# Patient Record
Sex: Female | Born: 1970 | Race: Black or African American | Hispanic: No | Smoking: Never smoker
Health system: Southern US, Community
[De-identification: ages and names within clinical notes are randomized; demographics above are authoritative.]

## PROBLEM LIST (undated history)

## (undated) DIAGNOSIS — D259 Leiomyoma of uterus, unspecified: Secondary | ICD-10-CM

## (undated) DIAGNOSIS — R000 Tachycardia, unspecified: Secondary | ICD-10-CM

## (undated) DIAGNOSIS — M1990 Unspecified osteoarthritis, unspecified site: Secondary | ICD-10-CM

## (undated) DIAGNOSIS — G43909 Migraine, unspecified, not intractable, without status migrainosus: Secondary | ICD-10-CM

## (undated) DIAGNOSIS — M549 Dorsalgia, unspecified: Secondary | ICD-10-CM

## (undated) DIAGNOSIS — F419 Anxiety disorder, unspecified: Secondary | ICD-10-CM

## (undated) DIAGNOSIS — K219 Gastro-esophageal reflux disease without esophagitis: Secondary | ICD-10-CM

## (undated) DIAGNOSIS — D509 Iron deficiency anemia, unspecified: Secondary | ICD-10-CM

## (undated) HISTORY — DX: Anxiety disorder, unspecified: F41.9

## (undated) HISTORY — PX: TUBAL LIGATION: SHX77

## (undated) HISTORY — DX: Migraine, unspecified, not intractable, without status migrainosus: G43.909

## (undated) HISTORY — DX: Gastro-esophageal reflux disease without esophagitis: K21.9

---

## 1998-06-23 ENCOUNTER — Other Ambulatory Visit: Admission: RE | Admit: 1998-06-23 | Discharge: 1998-06-23 | Payer: Self-pay | Admitting: Obstetrics and Gynecology

## 1998-09-29 ENCOUNTER — Encounter: Payer: Self-pay | Admitting: Emergency Medicine

## 1998-09-29 ENCOUNTER — Emergency Department (HOSPITAL_COMMUNITY): Admission: EM | Admit: 1998-09-29 | Discharge: 1998-09-29 | Payer: Self-pay | Admitting: Emergency Medicine

## 1999-07-02 ENCOUNTER — Other Ambulatory Visit: Admission: RE | Admit: 1999-07-02 | Discharge: 1999-07-02 | Payer: Self-pay | Admitting: Obstetrics and Gynecology

## 1999-12-25 ENCOUNTER — Encounter: Payer: Self-pay | Admitting: Emergency Medicine

## 1999-12-25 ENCOUNTER — Emergency Department (HOSPITAL_COMMUNITY): Admission: EM | Admit: 1999-12-25 | Discharge: 1999-12-25 | Payer: Self-pay | Admitting: Emergency Medicine

## 2000-01-30 ENCOUNTER — Other Ambulatory Visit: Admission: RE | Admit: 2000-01-30 | Discharge: 2000-01-30 | Payer: Self-pay | Admitting: Obstetrics and Gynecology

## 2000-03-26 ENCOUNTER — Encounter: Payer: Self-pay | Admitting: *Deleted

## 2000-03-26 ENCOUNTER — Ambulatory Visit (HOSPITAL_COMMUNITY): Admission: RE | Admit: 2000-03-26 | Discharge: 2000-03-26 | Payer: Self-pay | Admitting: *Deleted

## 2000-08-11 ENCOUNTER — Inpatient Hospital Stay (HOSPITAL_COMMUNITY): Admission: AD | Admit: 2000-08-11 | Discharge: 2000-08-14 | Payer: Self-pay | Admitting: Obstetrics and Gynecology

## 2000-08-13 ENCOUNTER — Encounter: Payer: Self-pay | Admitting: Obstetrics and Gynecology

## 2000-09-10 ENCOUNTER — Other Ambulatory Visit: Admission: RE | Admit: 2000-09-10 | Discharge: 2000-09-10 | Payer: Self-pay | Admitting: Obstetrics and Gynecology

## 2001-11-19 ENCOUNTER — Other Ambulatory Visit: Admission: RE | Admit: 2001-11-19 | Discharge: 2001-11-19 | Payer: Self-pay | Admitting: Obstetrics and Gynecology

## 2003-01-28 ENCOUNTER — Other Ambulatory Visit: Admission: RE | Admit: 2003-01-28 | Discharge: 2003-01-28 | Payer: Self-pay | Admitting: Obstetrics and Gynecology

## 2003-02-09 ENCOUNTER — Encounter: Payer: Self-pay | Admitting: Obstetrics and Gynecology

## 2003-02-09 ENCOUNTER — Encounter: Admission: RE | Admit: 2003-02-09 | Discharge: 2003-02-09 | Payer: Self-pay | Admitting: Obstetrics and Gynecology

## 2003-03-01 ENCOUNTER — Encounter: Admission: RE | Admit: 2003-03-01 | Discharge: 2003-03-01 | Payer: Self-pay | Admitting: Obstetrics and Gynecology

## 2004-10-22 ENCOUNTER — Ambulatory Visit (HOSPITAL_COMMUNITY): Admission: RE | Admit: 2004-10-22 | Discharge: 2004-10-22 | Payer: Self-pay | Admitting: Chiropractic Medicine

## 2005-11-23 ENCOUNTER — Emergency Department (HOSPITAL_COMMUNITY): Admission: EM | Admit: 2005-11-23 | Discharge: 2005-11-23 | Payer: Self-pay | Admitting: Emergency Medicine

## 2012-03-20 ENCOUNTER — Other Ambulatory Visit: Payer: Self-pay | Admitting: Orthopedic Surgery

## 2012-03-20 DIAGNOSIS — M5030 Other cervical disc degeneration, unspecified cervical region: Secondary | ICD-10-CM

## 2012-03-20 DIAGNOSIS — M79601 Pain in right arm: Secondary | ICD-10-CM

## 2012-03-26 ENCOUNTER — Ambulatory Visit
Admission: RE | Admit: 2012-03-26 | Discharge: 2012-03-26 | Disposition: A | Discharge status: Home / Self Care | Payer: 59 | Source: Ambulatory Visit | Attending: Orthopedic Surgery | Admitting: Orthopedic Surgery

## 2012-03-26 DIAGNOSIS — M5030 Other cervical disc degeneration, unspecified cervical region: Secondary | ICD-10-CM

## 2012-03-26 DIAGNOSIS — M79601 Pain in right arm: Secondary | ICD-10-CM

## 2014-04-13 ENCOUNTER — Other Ambulatory Visit: Payer: Self-pay | Admitting: Orthopedic Surgery

## 2014-04-13 DIAGNOSIS — M542 Cervicalgia: Secondary | ICD-10-CM

## 2014-04-13 DIAGNOSIS — M5412 Radiculopathy, cervical region: Secondary | ICD-10-CM

## 2014-04-16 ENCOUNTER — Ambulatory Visit
Admission: RE | Admit: 2014-04-16 | Discharge: 2014-04-16 | Disposition: A | Discharge status: Home / Self Care | Payer: 59 | Source: Ambulatory Visit | Attending: Orthopedic Surgery | Admitting: Orthopedic Surgery

## 2014-04-16 DIAGNOSIS — M5412 Radiculopathy, cervical region: Secondary | ICD-10-CM

## 2014-04-16 DIAGNOSIS — M542 Cervicalgia: Secondary | ICD-10-CM

## 2014-06-07 ENCOUNTER — Ambulatory Visit: Payer: 59 | Attending: Rehabilitation | Admitting: Physical Therapy

## 2014-06-07 DIAGNOSIS — M546 Pain in thoracic spine: Secondary | ICD-10-CM

## 2014-06-07 DIAGNOSIS — M50223 Other cervical disc displacement at C6-C7 level: Secondary | ICD-10-CM

## 2014-06-07 DIAGNOSIS — M5022 Other cervical disc displacement, mid-cervical region: Secondary | ICD-10-CM | POA: Insufficient documentation

## 2014-06-07 DIAGNOSIS — M6248 Contracture of muscle, other site: Secondary | ICD-10-CM | POA: Diagnosis not present

## 2014-06-07 DIAGNOSIS — M62838 Other muscle spasm: Secondary | ICD-10-CM

## 2014-06-07 NOTE — Patient Instructions (Addendum)
Posture Tips DO: - stand tall and erect - keep chin tucked in - keep head and shoulders in alignment - check posture regularly in mirror or large window - pull head back against headrest in car seat;  Change your position often.  Sit with lumbar support. DON'T: - slouch or slump while watching TV or reading - sit, stand or lie in one position  for too long;  Sitting is especially hard on the spine so if you sit at a desk/use the computer, then stand up often!   Copyright  VHI. All rights reserved.  Posture - Standing   Good posture is important. Avoid slouching and forward head thrust. Maintain curve in low back and align ears over shoul- ders, hips over ankles.  Pull your belly button in toward your back bone. Heart to sky. Even weight over balls of feet and heel. Not on toes   Copyright  VHI. All rights reserved.  Posture - Sitting   Sit upright, head facing forward. Try using a roll to support lower back. Keep shoulders relaxed, and avoid rounded back. Keep hips level with knees. Avoid crossing legs for long periods. Sit on sit bones not tailbone   Copyright  VHI. All rights reserved.  Levator Stretch   Grasp seat or sit on hand on side to be stretched. Turn head toward other side and look down into opposite arm pit.   Hold _25___ seconds. Repeat on other side. Can use heat before.Repeat __2-3_ times. Do _2-3___ sessions per day. Dont pull on your head.  http://[email or web address].exer.us/30   Copyright  VHI. All rights reserved.  Side-Bending   One hand on opposite side of head, pull head to side as far as is comfortable. Stop if there is pain. Hold __30__ seconds. Repeat with other hand to other side. Repeat __2-3__ times. Do 2-3____ sessions per day.   Copyright  VHI. All rights reserved.  Scapular Retraction (Standing)   Copyright  VHI. All rights reserved.  Chin Protraction / Retraction   Slide head forward keeping chin level. Slide head back, pulling chin in. Hold each  position __3_ seconds. Repeat _5__ times. Do _3__ sessions per day.  Copyright  VHI. All rights reserved.  Heat Therapy Heat therapy can help make painful, stiff muscles and joints feel better. Do not use heat on new injuries. Wait at least 48 hours after an injury to use heat. Do not use heat when you have aches or pains right after an activity. If you still have pain 3 hours after stopping the activity, then you may use heat. HOME CARE Wet heat pack  Soak a clean towel in warm water. Squeeze out the extra water.  Put the warm, wet towel in a plastic bag.  Place a thin, dry towel between your skin and the bag.  Put the heat pack on the area for 5 minutes, and check your skin. Your skin may be pink, but it should not be red.  Leave the heat pack on the area for 15 to 30 minutes.  Repeat this every 2 to 4 hours while awake. Do not use heat while you are sleeping. Warm water bath  Fill a tub with warm water.  Place the affected body part in the tub.  Soak the area for 20 to 40 minutes.  Repeat as needed. Hot water bottle  Fill the water bottle half full with hot water.  Press out the extra air. Close the cap tightly.  Place a dry towel between your  skin and the bottle.heat  Put the bottle on the area for 5 minutes, and check your skin. Your skin may be pink, but it should not be red.  Leave the bottle on the area for 15 to 30 minutes.  Repeat this every 2 to 4 hours while awake. Electric heating pad  Place a dry towel between your skin and the heating pad.  Set the heating pad on low heat.  Put the heating pad on the area for 10 minutes, and check your skin. Your skin may be pink, but it should not be red.  Leave the heating pad on the area for 20 to 40 minutes.  Repeat this every 2 to 4 hours while awake.  Do not lie on the heating pad.  Do not fall asleep while using the heating pad.  Do not use the heating pad near water. GET HELP RIGHT AWAY IF:  You get  blisters or red skin.  Your skin is puffy (swollen), or you lose feeling (numbness) in the affected area.  You have any new problems.  Your problems are getting worse.  You have any questions or concerns. If you have any problems, stop using heat therapy until you see your doctor. MAKE SURE YOU:  Understand these instructions.  Will watch your condition.  Will get help right away if you are not doing well or get worse. Document Released: 07/15/2011 Document Reviewed: 06/15/2013 [facility] Patient Information 2015 [place]. This information is not intended to replace advice given to you by your health care provider. Make sure you discuss any questions you have with your health care provider.   Voncille Lo, PT 06/07/2014 11:04 AM Phone: [phone] Fax: [phone]

## 2014-06-07 NOTE — Therapy (Signed)
[place], [place], [address] Phone: [phone]   Fax:  [phone]  Physical Therapy Evaluation  Patient Details  Name: Melody Sanchez MRN: [id number] Date of Birth: 1970-10-06 Referring Provider:  Zonia Kief, MD  Encounter Date: 06/07/2014      PT End of Session - 06/07/14 1141    Visit Number 1   Number of Visits 12   Date for PT Re-Evaluation 07/19/14   PT Start Time 8182   PT Stop Time 1125   PT Time Calculation (min) 70 min   Activity Tolerance Patient tolerated treatment well;Patient limited by pain   Behavior During Therapy [facility] for tasks assessed/performed      No past medical history on file.  No past surgical history on file.  There were no vitals taken for this visit.  Visit Diagnosis:  Herniated nucleus pulposus, C6-7  Bilateral thoracic back pain  Muscle spasms of neck      Subjective Assessment - 06/07/14 1030    Currently in Pain? Yes   Pain Score 7    Pain Location Back   Pain Orientation Mid   Pain Descriptors / Indicators Aching   Pain Type Chronic pain   Pain Frequency Intermittent   Aggravating Factors  sitting for more than 10 minutes   Pain Relieving Factors medication but really not anything   Effect of Pain on Daily Activities awakens her from sleep   Multiple Pain Sites Yes   Pain Score 10   Pain Type Chronic pain   Pain Location Neck   Pain Orientation Right;Left   Pain Radiating Towards down into elbow and R lateral hand ulnar nerve distribution   Pain Descriptors / Indicators Shooting;Aching;Spasm   Pain Frequency Constant   Pain Onset On-going          [facility] PT Assessment - 06/07/14 1033    Assessment   Medical Diagnosis cervical neck pain and mid back pain   Onset Date 04/02/14   Next MD Visit July 07, 2014   Prior Therapy none   Balance Screen   Has the patient fallen in the past 6 months No   Has the patient had a decrease in  activity level because of a fear of falling?  No   Is the patient reluctant to leave their home because of a fear of falling?  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   Observation/Other Assessments   Observations Pt with gaurded posture   Skin Integrity Pt with hypomobility in thoracic and cervical spine with increased pain in C 5/6/7 and T 4- to T 7 spinous processes,  Pt also with deep neck flexor tightning and shortened   Focus on Therapeutic Outcomes (FOTO)  FOTO limitation 97% predicted 56 %   Posture/Postural Control   Posture/Postural Control Postural limitations   Postural Limitations Rounded Shoulders;Forward head;Anterior pelvic tilt;Increased lumbar lordosis;Increased thoracic kyphosis   AROM   Right Shoulder Flexion 140 Degrees   Right Shoulder ABduction 140 Degrees  Right Shoulder Internal Rotation 35 Degrees   Right Shoulder External Rotation 90 Degrees   Left Shoulder Flexion 140 Degrees   Left Shoulder ABduction 140 Degrees   Left Shoulder Internal Rotation 28 Degrees   Left Shoulder External Rotation 90 Degrees   Cervical Flexion 42   Cervical Extension 19   Cervical - Right Side Bend 40   Cervical - Left Side Bend 35   Cervical - Right Rotation 35   Cervical - Left Rotation 39  ERP   Strength   Overall Strength Unable to assess   Overall Strength Comments Pt with spasms of neck and unable to assess strength.  At least 3/5 through out   Flexibility   Soft  Tissue Assessment /Muscle Lenght yes   Palpation   Palpation Pt with spasm paraspinally bil from C-3 to T-10                  [facility] Adult PT Treatment/Exercise - 06/07/14 1033    Posture/Postural Control   Posture Comments instructed in posture sittng and standing   Neck Exercises: Stretches   Upper Trapezius Stretch 2 reps;30 seconds  patient limited by pain bil   Upper Trapezius Stretch Limitations limited by pain in mid to ERP   Levator Stretch 2 reps;30 seconds  bil wit VC and TC   Levator Stretch Limitations limited by mid range to ERP   Other Neck Stretches neck Retraction 5 x for 3 - 5 seconds   Modalities   Modalities Electrical Stimulation;Moist Heat   Moist Heat Therapy   Number Minutes Moist Heat 15 Minutes   Moist Heat Location --  upper back and neck bil   Electrical Stimulation   Electrical Stimulation Location cervical and upper back   Electrical Stimulation Action IFC   Electrical Stimulation Parameters to tolerance   Electrical Stimulation Goals Pain                PT Education - 06/07/14 1140    Education provided Yes   Education Details Pt given handout for neck upper trap/levator, neck retraction and info on use of heat as well as posture instruction for sitting and standing posture   Person(s) Educated Patient   Methods Explanation;Demonstration;Tactile cues;Verbal cues;Handout   Comprehension Verbalized understanding;Returned demonstration;Verbal cues required;Tactile cues required;Need further instruction          PT Short Term Goals - 06/07/14 1148    PT SHORT TERM GOAL #1   Title "Independent with initial HEP   Time 3   Period Weeks   Status New   PT SHORT TERM GOAL #2   Title "Report pain decrease from10 /10 to 6/10   Time 3   Period Weeks   Status New   PT SHORT TERM GOAL #3   Title "Demonstrate understanding of proper sitting posture, body mechanics, work ergonomics, and be more conscious of position and posture  throughout the day   Time 3   Period Weeks   Status New           PT Long Term Goals - 06/07/14 1150    PT LONG TERM GOAL #1   Title "Demonstrate and verbalize techniques to reduce the risk of re-injury including: lifting, posture, body mechanics.    Time 6   Period Weeks   Status New   PT LONG TERM GOAL #2   Title "Pt will be independent with advanced HEP.    Time 6   Period Weeks   Status New  PT LONG TERM GOAL #3   Title "FOTO will improve from  97%  to   56%  indicating improved functional mobility   Time 6   Period Weeks   Status New   PT LONG TERM GOAL #4   Title "Pt will tolerate sitting 1 hour without increased pain to ride in car without increased pain.   Time 6   Period Weeks   Status New   PT LONG TERM GOAL #5   Title "Pt will tolerate working on computer for 2 hours without increased pain in order to return to PLOF and work.    Time 6   Period Weeks   Status New   Additional Long Term Goals   Additional Long Term Goals Yes   PT LONG TERM GOAL #6   Title "Pain will decrease to 3/10 or less with all functional activities   Time 6   Period Weeks   Status New               Plan - 06/07/14 1144    Clinical Impression Statement 44 yo female was involved in [place] in April 02, 2014. And works as a Radiation protection practitioner and data entry and has been unable to work since April 06, 2014.   Since then patient has experience increasing pain in bilateral neck paraspinals but with mostly R radiating pain to the hand and fingers in ulnar distribuation.  Sitting for longer than 5 to 10 minutes increases pain in neck.  She is having difficulty performing household chores and driving.  Pt is concerned about losing her job and wants to be able to tolerate sitting in order to return to her work.  She is having difficulty sleeping at night awakening whenever she turns.  Pt with limited AROM of neck with mid to end range pain and sever muscle guarding with any  attempt at resistance.  Pt would benefit from Ther EX, manual and modalities including dry needling as patient tolerate.  She may also benefit from iontophoresis for inflammation if MD  signs order.     Pt will benefit from skilled therapeutic intervention in order to improve on the following deficits Decreased activity tolerance;Decreased mobility;Decreased range of motion;Decreased strength;Decreased endurance;Increased fascial restricitons;Increased muscle spasms;Pain;Impaired UE functional use;Impaired sensation;Impaired flexibility;Improper body mechanics;Postural dysfunction  iontophoresis   Rehab Potential Good   PT Frequency 2x / week   PT Duration 6 weeks   PT Treatment/Interventions ADLs/Self Care Home Management;Cryotherapy;Electrical Stimulation;Ultrasound;Traction;Moist Heat;Neuromuscular re-education;Therapeutic exercise;Therapeutic activities;Patient/family education;Manual techniques;Dry needling;Passive range of motion   PT Next Visit Plan continue with possible dry needling and assess initial stretches.  Posture and body mechanics     By signing I understand that I am ordering/authorizing the use of Iontophoresis using 4 mg/mL of dexamethasone as a component of this plan of care.    Problem List There are no active problems to display for this patient.  Voncille Lo, PT 06/07/2014 11:56 AM Phone: [phone] Fax: [facility] [address] [place], [place], [address] Phone: [phone]   Fax:  [phone]

## 2014-06-09 ENCOUNTER — Ambulatory Visit: Payer: 59 | Admitting: Physical Therapy

## 2014-06-09 DIAGNOSIS — M50223 Other cervical disc displacement at C6-C7 level: Secondary | ICD-10-CM

## 2014-06-09 DIAGNOSIS — M62838 Other muscle spasm: Secondary | ICD-10-CM

## 2014-06-09 DIAGNOSIS — M5022 Other cervical disc displacement, mid-cervical region: Secondary | ICD-10-CM | POA: Diagnosis not present

## 2014-06-09 DIAGNOSIS — M546 Pain in thoracic spine: Secondary | ICD-10-CM

## 2014-06-09 NOTE — Therapy (Signed)
[place], [place], [address] Phone: [phone]   Fax:  [phone]  Physical Therapy Treatment  Patient Details  Name: Melody Sanchez MRN: [id number] Date of Birth: 01-Jun-1970 Referring Provider:  Zonia Kief, MD  Encounter Date: 06/09/2014      PT End of Session - 06/09/14 0844    Visit Number 2   Number of Visits 12   Date for PT Re-Evaluation 07/19/14   PT Start Time 0800   PT Stop Time 0902   PT Time Calculation (min) 62 min   Activity Tolerance Patient tolerated treatment well;Patient limited by pain   Behavior During Therapy [facility] for tasks assessed/performed      No past medical history on file.  No past surgical history on file.  There were no vitals taken for this visit.  Visit Diagnosis:  Herniated nucleus pulposus, C6-7  Bilateral thoracic back pain  Muscle spasms of neck      Subjective Assessment - 06/09/14 0802    Symptoms mid back pain about the same , but continuing Right neck pain as on eval   Currently in Pain? Yes   Pain Score 7    Pain Location Back   Pain Orientation Mid   Pain Descriptors / Indicators Aching;Burning   Pain Type Chronic pain   Pain Onset More than a month ago   Pain Frequency Intermittent   Aggravating Factors  sitting for more than 10 minutes, unable to lift arms to do hair,  difficult turning neck to drive   Pain Relieving Factors medication   Multiple Pain Sites Yes   Pain Score 8   Pain Type Chronic pain   Pain Location Neck   Pain Orientation Right;Left  right greater than Left   Pain Radiating Towards down into elbow into R lateral hand   Pain Descriptors / Indicators Aching;Burning   Pain Frequency Constant   Pain Onset On-going                    [facility] Adult PT Treatment/Exercise - 06/09/14 0815    Neck Exercises: Stretches   Upper Trapezius Stretch 2 reps;30 seconds  bil with VC for correct technique with TC   Upper Trapezius Stretch Limitations pt with 7/10 pain from 10/10   Levator Stretch 2 reps;30 seconds  bil reviewed for correct technique. pt was incorrectly doing   Levator Stretch Limitations Pt returned correct demo VC and TC   Neck Stretch 5 reps;10 seconds  VC and TC and up against door   Shoulder Exercises: Stretch   Other Shoulder Stretches shoulder shrugs 10 reps   Modalities   Modalities Electrical Stimulation   Moist Heat Therapy   Number Minutes Moist Heat 15 Minutes   Moist Heat Location --  upper back and neck   Electrical Stimulation   Electrical Stimulation Location cervical and upper back   Electrical Stimulation Goals Pain   Manual Therapy   Manual Therapy Myofascial release;Joint mobilization   Joint Mobilization C 3 to C-6 R PA mob thoracic PA T-2 to T-6   Myofascial Release levator, upper trap and rhomboid Right          Trigger Point Dry Needling - 06/09/14 0816    Consent Given? Yes   Education Handout Provided Yes   Muscles Treated Upper Body --  Erector Spinae C-3 to C-6 R side   Upper Trapezius Response Twitch reponse elicited;Palpable increased muscle length  Right   Levator Scapulae  Response Twitch response elicited;Palpable increased muscle length  right   Rhomboids Response Twitch response elicited;Palpable increased muscle length              PT Education - 06/09/14 0844    Education Details Dry needling trigger point precautians, shoulder shrug and review if intial HEP   Person(s) Educated Patient   Methods Explanation;Demonstration;Verbal cues   Comprehension Verbalized understanding;Returned demonstration;Verbal cues required;Tactile cues required          PT Short Term Goals - 06/09/14 0847    PT SHORT TERM GOAL #1   Title "Independent with initial HEP   Time 3   Period Weeks   Status On-going   PT SHORT TERM GOAL #2   Title "Report pain decrease from10 /10 to 6/10   Time 3   Period Weeks   Status On-going   PT SHORT  TERM GOAL #3   Title "Demonstrate understanding of proper sitting posture, body mechanics, work ergonomics, and be more conscious of position and posture throughout the day   Time 3   Period Weeks   Status On-going           PT Long Term Goals - 06/09/14 0847    PT LONG TERM GOAL #1   Title "Demonstrate and verbalize techniques to reduce the risk of re-injury including: lifting, posture, body mechanics.    Time 6   Period Weeks   Status On-going   PT LONG TERM GOAL #2   Title "Pt will be independent with advanced HEP.    Time 6   Period Weeks   Status On-going   PT LONG TERM GOAL #3   Title "FOTO will improve from  97%  to   56%  indicating improved functional mobility   Time 6   Period Weeks   Status On-going   PT LONG TERM GOAL #4   Title "Pt will tolerate sitting 1 hour without increased pain to ride in car without increased pain.   Time 6   Period Weeks   Status On-going   PT LONG TERM GOAL #5   Title "Pt will tolerate working on computer for 2 hours without increased pain in order to return to PLOF and work.    Time 6   Period Weeks   Status On-going   PT LONG TERM GOAL #6   Title "Pain will decrease to 3/10 or less with all functional activities   Time 6   Period Weeks   Status On-going               Plan - 06/09/14 0845    Clinical Impression Statement Pt is at 7- 8/10 from 10/10 last time.  Pt needed to be re instructed in stretches due to pt not correctly executing.  Pt tolerated Dry needling well and had palpable muscle lengthening  and sever localized twitch responses.  Pt is more awar of posture and was sitting with more elevated chest and length in sitting   Pt will benefit from skilled therapeutic intervention in order to improve on the following deficits Decreased activity tolerance;Decreased mobility;Decreased range of motion;Decreased strength;Decreased endurance;Increased fascial restricitons;Increased muscle spasms;Pain;Impaired UE functional  use;Impaired sensation;Impaired flexibility;Improper body mechanics;Postural dysfunction   Rehab Potential Good   PT Frequency 2x / week   PT Duration 6 weeks   PT Treatment/Interventions ADLs/Self Care Home Management;Cryotherapy;Electrical Stimulation;Ultrasound;Traction;Moist Heat;Neuromuscular re-education;Therapeutic exercise;Therapeutic activities;Patient/family education;Manual techniques;Dry needling;Passive range of motion   PT Next Visit Plan Continue with scapular strength and reasses dry  needling        Problem List There are no active problems to display for this patient.   Voncille Lo, PT 06/09/2014 2:24 PM Phone: [phone] Fax: [facility] [address] [place], [place], [address] Phone: [phone]   Fax:  [phone]

## 2014-06-09 NOTE — Patient Instructions (Addendum)
Trigger Point Dry Needling  . What is Trigger Point Dry Needling (DN)? o DN is a physical therapy technique used to treat muscle pain and dysfunction. Specifically, DN helps deactivate muscle trigger points (muscle knots).  o A thin filiform needle is used to penetrate the skin and stimulate the underlying trigger point. The goal is for a local twitch response (LTR) to occur and for the trigger point to relax. No medication of any kind is injected during the procedure.   . What Does Trigger Point Dry Needling Feel Like?  o The procedure feels different for each individual patient. Some patients report that they do not actually feel the needle enter the skin and overall the process is not painful. Very mild bleeding may occur. However, many patients feel a deep cramping in the muscle in which the needle was inserted. This is the local twitch response.   Marland Kitchen How Will I feel after the treatment? o Soreness is normal, and the onset of soreness may not occur for a few hours. Typically this soreness does not last longer than two days.  o Bruising is uncommon, however; ice can be used to decrease any possible bruising.  o In rare cases feeling tired or nauseous after the treatment is normal. In addition, your symptoms may get worse before they get better, this period will typically not last longer than 24 hours.   . What Can I do After My Treatment? o Increase your hydration by drinking more water for the next 24 hours. o You may place ice or heat on the areas treated that have become sore, however, do not use heat on inflamed or bruised areas. Heat often brings more relief post needling. o You can continue your regular activities, but vigorous activity is not recommended initially after the treatment for 24 hours. o DN is best combined with other physical therapy such as strengthening, stretching, and other therapies.   Add shoulder shrug exercise to your treatment as shown  10 repetitions shoulders back  and into back pocket  Voncille Lo, PT 06/09/2014 8:19 AM Phone: [phone] Fax: [phone]

## 2014-06-14 ENCOUNTER — Ambulatory Visit: Payer: 59 | Admitting: Physical Therapy

## 2014-06-16 ENCOUNTER — Ambulatory Visit: Payer: 59 | Admitting: Rehabilitation

## 2014-06-16 DIAGNOSIS — M62838 Other muscle spasm: Secondary | ICD-10-CM

## 2014-06-16 DIAGNOSIS — M50223 Other cervical disc displacement at C6-C7 level: Secondary | ICD-10-CM

## 2014-06-16 DIAGNOSIS — M546 Pain in thoracic spine: Secondary | ICD-10-CM

## 2014-06-16 DIAGNOSIS — M5022 Other cervical disc displacement, mid-cervical region: Secondary | ICD-10-CM | POA: Diagnosis not present

## 2014-06-16 NOTE — Patient Instructions (Signed)
PERFORM WITH THERABAND  Chest Fly   Lie on back with knees bent, arms extended to sides and slightly bent, palms facing up.Pull band apart, arms straight. Hold 1 count. Slowly return to starting position. Repeat ___10_ times per set. Do 1-2____ sets per session.Do 1-2 times per day May be done with dumbbells, tubing or resistive band.  Copyright  VHI. All rights reserved.      PNF Strengthening: Resisted PERFORM SUPINE    Standing with resistive band around each hand, bring right arm up and away, thumb back. Repeat _10___ times per set. Do _1-2___ sets per session. Do __1-2__ sessions per day.  http://orth.exer.us/918

## 2014-06-16 NOTE — Therapy (Signed)
[place], [place], [address] Phone: [phone]   Fax:  [phone]  Physical Therapy Treatment  Patient Details  Name: Melody Sanchez MRN: [id number] Date of Birth: 03-02-1971 Referring Provider:  Zonia Kief, MD  Encounter Date: 06/16/2014      PT End of Session - 06/16/14 1234    Visit Number 3   Number of Visits 12   Date for PT Re-Evaluation 07/19/14   PT Start Time 1100   PT Stop Time 1200   PT Time Calculation (min) 60 min      No past medical history on file.  No past surgical history on file.  There were no vitals taken for this visit.  Visit Diagnosis:  Herniated nucleus pulposus, C6-7  Bilateral thoracic back pain  Muscle spasms of neck        [facility] PT Assessment - 06/16/14 1105    AROM   Cervical Flexion 45   Cervical Extension 35   Cervical - Right Side Bend 40   Cervical - Left Side Bend 44   Cervical - Right Rotation 85   Cervical - Left Rotation 85   Strength   Right Shoulder Flexion 4-/5   Right Shoulder ABduction 4+/5   Right Shoulder Internal Rotation 3+/5   Right Shoulder External Rotation 4+/5   Left Shoulder Flexion 4+/5   Left Shoulder ABduction 4+/5   Right Elbow Flexion 4/5   Right Elbow Extension 4/5   Left Elbow Flexion 5/5   Left Elbow Extension 5/5                  [facility] Adult PT Treatment/Exercise - 06/16/14 1124    Elbow Exercises   Elbow Flexion Strengthening;Right;10 reps;Seated;Theraband   Theraband Level (Elbow Flexion) Level 1 (Yellow)   Elbow Extension Strengthening;Right;10 reps;Seated;Theraband   Theraband Level (Elbow Extension) Level 1 (Yellow)   Neck Exercises: Seated   Postural Training Scap retractions 10 reps 5 seconds   Shoulder Exercises: Supine   Horizontal ABduction Strengthening;Both;10 reps;Theraband   Theraband Level (Shoulder Horizontal ABduction) Level 1 (Yellow)   Horizontal ABduction Limitations also  diagonals x 15 each way   External Rotation Strengthening;Both;20 reps;Theraband   Theraband Level (Shoulder External Rotation) Level 1 (Yellow)   Neck Exercises: Stretches   Levator Stretch 2 reps;30 seconds  bil reviewed for correct technique. pt was incorrectly doing   Levator Stretch Limitations Pt returned correct demo VC and TC                PT Education - 06/16/14 1233    Education provided Yes   Education Details Supne Scapular exercises including horiz abdct with yellow band and diagonals   Person(s) Educated Patient   Methods Handout;Explanation   Comprehension Verbalized understanding          PT Short Term Goals - 06/16/14 1108    PT SHORT TERM GOAL #1   Title "Independent with initial HEP   Time 3   Period Weeks   Status Achieved   PT SHORT TERM GOAL #2   Title "Report pain decrease from10 /10 to 6/10   Time 3   Period Weeks   Status Achieved   PT SHORT TERM GOAL #3   Title "Demonstrate understanding of proper sitting posture, body mechanics, work ergonomics, and be more conscious of position and posture throughout the day   Time 3   Period Weeks   Status On-going  PT Long Term Goals - 06/16/14 1108    PT LONG TERM GOAL #1   Title "Demonstrate and verbalize techniques to reduce the risk of re-injury including: lifting, posture, body mechanics.    Time 6   Period Weeks   Status On-going   PT LONG TERM GOAL #2   Title "Pt will be independent with advanced HEP.    Time 6   Period Weeks   Status On-going   PT LONG TERM GOAL #3   Title "FOTO will improve from  97%  to   56%  indicating improved functional mobility   Time 6   Period Weeks   Status On-going   PT LONG TERM GOAL #4   Title "Pt will tolerate sitting 1 hour without increased pain to ride in car without increased pain.   Time 6   Period Weeks   Status Achieved   PT LONG TERM GOAL #5   Title "Pt will tolerate working on computer for 2 hours without increased pain in  order to return to PLOF and work.    Time 6   Period Weeks   Status On-going   PT LONG TERM GOAL #6   Title "Pain will decrease to 3/10 or less with all functional activities   Time 6   Period Weeks   Status On-going               Plan - 06/16/14 1120    Clinical Impression Statement Pt reports dry needling was very helpful. AROM cervical spine greatly improved. See goals met. Pain decreasing. Sitting tolerance improving. Pt was able to watch daughter's basketball game, which previously she was unable to attend.    PT Next Visit Plan Continue with scapular strength and reasses dry needling        Problem List There are no active problems to display for this patient.   Dorene Ar , [place]  06/16/2014, 12:38 PM  [place], [place], [address] Phone: [phone]   Fax:  [phone]

## 2014-06-21 ENCOUNTER — Ambulatory Visit: Payer: 59 | Admitting: Rehabilitation

## 2014-06-21 DIAGNOSIS — M62838 Other muscle spasm: Secondary | ICD-10-CM

## 2014-06-21 DIAGNOSIS — M5022 Other cervical disc displacement, mid-cervical region: Secondary | ICD-10-CM | POA: Diagnosis not present

## 2014-06-21 DIAGNOSIS — M50223 Other cervical disc displacement at C6-C7 level: Secondary | ICD-10-CM

## 2014-06-21 DIAGNOSIS — M546 Pain in thoracic spine: Secondary | ICD-10-CM

## 2014-06-21 NOTE — Patient Instructions (Addendum)
Posture - Sitting   Sit upright, head facing forward. Keep ears in line with shoulders.  Try using a roll to support lower back. Keep shoulders relaxed, and avoid rounded back. Keep hips level with knees. Avoid crossing legs for long periods.   Copyright  VHI. All rights reserved.  Shoulder Blade Squeeze   Rotate shoulders back, then squeeze shoulder blades together.Hold 5 seconds Repeat __10__ times. Do _2-3___ sessions per day.  http://gt2.exer.us/847   Copyright  VHI. All rights reserved.  Stretch Break - Chin Tuck   Looking straight forward, tuck chin and hold __5__ seconds. Relax and return to starting position. Repeat __3__ times per day  Copyright  VHI. All rights reserved.

## 2014-06-21 NOTE — Therapy (Signed)
[place], [place], [address] Phone: [phone]   Fax:  [phone]  Physical Therapy Treatment  Patient Details  Name: KIMBERLLY NORGARD MRN: [id number] Date of Birth: 1971-02-17 Referring Provider:  Zonia Kief, MD  Encounter Date: 06/21/2014      PT End of Session - 06/21/14 1104    Visit Number 4   Number of Visits 12   Date for PT Re-Evaluation 07/19/14   PT Start Time 1100      No past medical history on file.  No past surgical history on file.  There were no vitals taken for this visit.  Visit Diagnosis:  Herniated nucleus pulposus, C6-7  Bilateral thoracic back pain  Muscle spasms of neck      Subjective Assessment - 06/21/14 1102    Symptoms Pain is not burning down my arm. Just achy down arm and across top of right shoulder/neck. Experienced a little pain increase after exercises last visit.    Currently in Pain? Yes   Pain Score 6    Pain Location Shoulder  right arm   Pain Orientation Right   Pain Descriptors / Indicators Aching   Pain Frequency Constant   Aggravating Factors  sitting for more than 1 hour, has not returned to driving   Pain Relieving Factors rest                    [facility] Adult PT Treatment/Exercise - 06/21/14 0001    Neck Exercises: Seated   Neck Retraction 10 reps   Postural Training Scap retractions 10 reps 5 seconds   Neck Exercises: Supine   Neck Retraction 10 reps  with arm circles and horiz abdct/addct x 10 each   Modalities   Modalities Traction   Moist Heat Therapy   Number Minutes Moist Heat 15 Minutes   Moist Heat Location --  cervical   Electrical Stimulation   Electrical Stimulation Location cervical and upper back   Electrical Stimulation Action IFC   Electrical Stimulation Parameters 8   Electrical Stimulation Goals Pain   Traction   Type of Traction Cervical   Min (lbs) 12   Max (lbs) 6   Hold Time 60   Rest Time  15   Time 10                PT Education - 06/21/14 1135    Education provided Yes   Education Details Sitting posture, shoulder blade squeeze and chin tuck   Person(s) Educated Patient   Methods Explanation;Handout   Comprehension Verbalized understanding          PT Short Term Goals - 06/16/14 1108    PT SHORT TERM GOAL #1   Title "Independent with initial HEP   Time 3   Period Weeks   Status Achieved   PT SHORT TERM GOAL #2   Title "Report pain decrease from10 /10 to 6/10   Time 3   Period Weeks   Status Achieved   PT SHORT TERM GOAL #3   Title "Demonstrate understanding of proper sitting posture, body mechanics, work ergonomics, and be more conscious of position and posture throughout the day   Time 3   Period Weeks   Status On-going           PT Long Term Goals - 06/16/14 1108    PT LONG TERM GOAL #1   Title "Demonstrate and verbalize techniques to reduce the risk of re-injury including: lifting, posture,  body mechanics.    Time 6   Period Weeks   Status On-going   PT LONG TERM GOAL #2   Title "Pt will be independent with advanced HEP.    Time 6   Period Weeks   Status On-going   PT LONG TERM GOAL #3   Title "FOTO will improve from  97%  to   56%  indicating improved functional mobility   Time 6   Period Weeks   Status On-going   PT LONG TERM GOAL #4   Title "Pt will tolerate sitting 1 hour without increased pain to ride in car without increased pain.   Time 6   Period Weeks   Status Achieved   PT LONG TERM GOAL #5   Title "Pt will tolerate working on computer for 2 hours without increased pain in order to return to PLOF and work.    Time 6   Period Weeks   Status On-going   PT LONG TERM GOAL #6   Title "Pain will decrease to 3/10 or less with all functional activities   Time 6   Period Weeks   Status On-going               Plan - 06/21/14 1130    Clinical Impression Statement Pt reports some increased pain after last visit  but overall still much better after the dry needling session. She would like to schedule more dry needling sessions. Short trial of mechanical  cervical traction today.   PT Next Visit Plan low level cervical and scapular exercises. repeat traction if helpful. continue modalities, try ultrasound.         Problem List There are no active problems to display for this patient.   Dorene Ar, [place] 06/21/2014, 12:59 PM  [place], [place], [address] Phone: [phone]   Fax:  [phone]

## 2014-06-23 ENCOUNTER — Ambulatory Visit: Payer: 59 | Admitting: Rehabilitation

## 2014-06-23 DIAGNOSIS — M62838 Other muscle spasm: Secondary | ICD-10-CM

## 2014-06-23 DIAGNOSIS — M5022 Other cervical disc displacement, mid-cervical region: Secondary | ICD-10-CM | POA: Diagnosis not present

## 2014-06-23 DIAGNOSIS — M546 Pain in thoracic spine: Secondary | ICD-10-CM

## 2014-06-23 DIAGNOSIS — M50223 Other cervical disc displacement at C6-C7 level: Secondary | ICD-10-CM

## 2014-06-23 NOTE — Patient Instructions (Signed)
Perform chin tucks in supine Hold 5 sec x 10 Maintain chin tuck with 10 small arm circles Maintain chin tuck with 10 small arm openings at shoulder height. Maintain chink tuck with 10 small alternating arms above chest and shoulders Stop if increased pain

## 2014-06-23 NOTE — Therapy (Addendum)
[place], [place], [address] Phone: [phone]   Fax:  [phone]  Physical Therapy Treatment  Patient Details  Name: Melody Sanchez MRN: [id number] Date of Birth: 04-12-1971 Referring Provider:  Zonia Kief, MD  Encounter Date: 06/23/2014      PT End of Session - 06/23/14 1049    Visit Number 5   Number of Visits 12   Date for PT Re-Evaluation 07/19/14   PT Start Time 1017   PT Stop Time 1115   PT Time Calculation (min) 58 min      No past medical history on file.  No past surgical history on file.  There were no vitals taken for this visit.  Visit Diagnosis:  Herniated nucleus pulposus, C6-7  Bilateral thoracic back pain  Muscle spasms of neck      Subjective Assessment - 06/23/14 1020    Symptoms I was hurting more in my neck and less in my right arm and shoulder. 5/10 before meds and 3/10 now. Clovis Fredrickson                     [facility] Adult PT Treatment/Exercise - 06/23/14 1021    Neck Exercises: Seated   Cervical Isometrics Extension;Right lateral flexion;Left lateral flexion;Right rotation;Left rotation;5 secs;5 reps  rotation performed in supine   Neck Retraction 10 reps   X to V 10 reps   W Back 10 reps   Postural Training Scap retractions 10 reps 5 seconds   Neck Exercises: Supine   Neck Retraction 10 reps  with arm circles and horiz abdct/addct x 10 each   Shoulder Exercises: Supine   Other Supine Exercises chest press up with scap retract x 10   Modalities   Modalities Traction   Moist Heat Therapy   Number Minutes Moist Heat 15 Minutes   Moist Heat Location --  cervical   Electrical Stimulation   Electrical Stimulation Location cervical and upper back   Electrical Stimulation Action IFC    Electrical Stimulation Parameters 8   Electrical Stimulation Goals Pain   Traction   Type of Traction Cervical   Min (lbs) 15   Max (lbs) 7   Hold Time 60   Rest  Time 15   Time 12                PT Education - 06/23/14 1048    Education provided Yes   Education Details Supine cervical stab level 2; where to order TENS   Person(s) Educated Patient   Methods Explanation;Handout   Comprehension Verbalized understanding          PT Short Term Goals - 06/16/14 1108    PT SHORT TERM GOAL #1   Title "Independent with initial HEP   Time 3   Period Weeks   Status Achieved   PT SHORT TERM GOAL #2   Title "Report pain decrease from10 /10 to 6/10   Time 3   Period Weeks   Status Achieved   PT SHORT TERM GOAL #3   Title "Demonstrate understanding of proper sitting posture, body mechanics, work ergonomics, and be more conscious of position and posture throughout the day   Time 3   Period Weeks   Status On-going           PT Long Term Goals - 06/16/14 1108    PT LONG TERM GOAL #1   Title "Demonstrate and verbalize techniques to reduce the risk of re-injury including:  lifting, posture, body mechanics.    Time 6   Period Weeks   Status On-going   PT LONG TERM GOAL #2   Title "Pt will be independent with advanced HEP.    Time 6   Period Weeks   Status On-going   PT LONG TERM GOAL #3   Title "FOTO will improve from  97%  to   56%  indicating improved functional mobility   Time 6   Period Weeks   Status On-going   PT LONG TERM GOAL #4   Title "Pt will tolerate sitting 1 hour without increased pain to ride in car without increased pain.   Time 6   Period Weeks   Status Achieved   PT LONG TERM GOAL #5   Title "Pt will tolerate working on computer for 2 hours without increased pain in order to return to PLOF and work.    Time 6   Period Weeks   Status On-going   PT LONG TERM GOAL #6   Title "Pain will decrease to 3/10 or less with all functional activities   Time 6   Period Weeks   Status On-going               Plan - 06/23/14 1045    Clinical Impression Statement decreased radicular sx after traction. Pt  demonstrates cervical weakness with supine chin tuck series. Difficulty maintaining chin tuck with arm movements. Added to HEP and instructed to perform with small arm movements.   PT Next Visit Plan low level cervical and scapular exercises. repeat traction if helpful. continue modalities, try ultrasound.         Problem List There are no active problems to display for this patient.   Dorene Ar, [place] 06/23/2014, 11:01 AM  [facility] [address] [place], [place], [address] Phone: [phone]   Fax:  [phone]

## 2014-06-27 ENCOUNTER — Ambulatory Visit: Payer: 59 | Admitting: Rehabilitation

## 2014-06-27 DIAGNOSIS — M546 Pain in thoracic spine: Secondary | ICD-10-CM

## 2014-06-27 DIAGNOSIS — M62838 Other muscle spasm: Secondary | ICD-10-CM

## 2014-06-27 DIAGNOSIS — M50223 Other cervical disc displacement at C6-C7 level: Secondary | ICD-10-CM

## 2014-06-27 DIAGNOSIS — M5022 Other cervical disc displacement, mid-cervical region: Secondary | ICD-10-CM | POA: Diagnosis not present

## 2014-06-27 NOTE — Patient Instructions (Signed)
EXTENSION: Standing - Resistance Band: Stable (Active)   Stand, right arm at side. Against yellow resistance band, draw arm backward, as far as possible, keeping elbow straight. Complete _1__ sets of __10_ repetitions. Perform _2__ sessions per day.    Copyright  VHI. All rights reserved.  Resistive Band Rowing   With resistive band anchored in door, grasp both ends. Keeping elbows bent, pull back, squeezing shoulder blades together. Hold 5____ seconds. Repeat __10__ times. Do __2__ sessions per day.  http://gt2.exer.us/97   Copyright  VHI. All rights reserved.

## 2014-06-27 NOTE — Therapy (Signed)
[place], [place], [address] Phone: [phone]   Fax:  [phone]  Physical Therapy Treatment  Patient Details  Name: Melody Sanchez MRN: [id number] Date of Birth: 30-May-1970 Referring Provider:  Zonia Kief, MD  Encounter Date: 06/27/2014      PT End of Session - 06/27/14 0921    Visit Number 6   Number of Visits 12   Date for PT Re-Evaluation 07/19/14   PT Start Time 0850   PT Stop Time 0945   PT Time Calculation (min) 55 min      No past medical history on file.  No past surgical history on file.  There were no vitals taken for this visit.  Visit Diagnosis:  Herniated nucleus pulposus, C6-7  Bilateral thoracic back pain  Muscle spasms of neck      Subjective Assessment - 06/27/14 0850    Symptoms 3/10 much better. Still intermittent right arm pain.    Currently in Pain? Yes   Pain Score 3    Pain Location Neck   Pain Descriptors / Indicators Aching   Pain Type Chronic pain   Pain Frequency Constant   Aggravating Factors  sitting greater than 1 hour, carrying laundry/ grocerries.    Pain Relieving Factors rest                    [facility] Adult PT Treatment/Exercise - 06/27/14 0902    Neck Exercises: Machines for Strengthening   UBE (Upper Arm Bike) Level 1 x 2 min forward, 2 min back   Neck Exercises: Theraband   Shoulder Extension 10 reps;Other (comment)  yellow   Rows 10 reps  yellow   Neck Exercises: Supine   Neck Retraction 10 reps  with arm circles and horiz abdct/addct x 10 each   Neck Retraction Limitations repeat 2 times with larger arm movements due to patient demonstrating more strength and motor control today.    Modalities   Modalities Electrical Stimulation;Moist Heat;Traction   Moist Heat Therapy   Number Minutes Moist Heat 15 Minutes   Moist Heat Location --  neck   Electrical Stimulation   Electrical Stimulation Location cervical and upper  back   Electrical Stimulation Action IFC   Electrical Stimulation Parameters 8   Electrical Stimulation Goals Pain   Traction   Type of Traction Cervical   Min (lbs) 14   Max (lbs) 7   Hold Time 60   Rest Time 15   Time 12                PT Education - 06/27/14 [phone]    Education provided Yes   Education Details Rows and shoulder ext with yellow band   Person(s) Educated Patient   Methods Explanation;Handout   Comprehension Verbalized understanding          PT Short Term Goals - 06/16/14 1108    PT SHORT TERM GOAL #1   Title "Independent with initial HEP   Time 3   Period Weeks   Status Achieved   PT SHORT TERM GOAL #2   Title "Report pain decrease from10 /10 to 6/10   Time 3   Period Weeks   Status Achieved   PT SHORT TERM GOAL #3   Title "Demonstrate understanding of proper sitting posture, body mechanics, work ergonomics, and be more conscious of position and posture throughout the day   Time 3   Period Weeks   Status On-going  PT Long Term Goals - 06/16/14 1108    PT LONG TERM GOAL #1   Title "Demonstrate and verbalize techniques to reduce the risk of re-injury including: lifting, posture, body mechanics.    Time 6   Period Weeks   Status On-going   PT LONG TERM GOAL #2   Title "Pt will be independent with advanced HEP.    Time 6   Period Weeks   Status On-going   PT LONG TERM GOAL #3   Title "FOTO will improve from  97%  to   56%  indicating improved functional mobility   Time 6   Period Weeks   Status On-going   PT LONG TERM GOAL #4   Title "Pt will tolerate sitting 1 hour without increased pain to ride in car without increased pain.   Time 6   Period Weeks   Status Achieved   PT LONG TERM GOAL #5   Title "Pt will tolerate working on computer for 2 hours without increased pain in order to return to PLOF and work.    Time 6   Period Weeks   Status On-going   PT LONG TERM GOAL #6   Title "Pain will decrease to 3/10 or less  with all functional activities   Time 6   Period Weeks   Status On-going               Plan - 06/27/14 3875    Clinical Impression Statement Still out of work. Going to [place] March 3rd. May return to work March 8th. Returned to driving for first time without increased pain. Pain ranges from 3-5/10.   PT Next Visit Plan continue neck and scap strength stab, cont traction and modalities. Pt sees MD next week. Send MD note.         Problem List There are no active problems to display for this patient.   Dorene Ar, [place] 06/27/2014, 9:22 AM  [facility] [address] [place], [place], [address] Phone: [phone]   Fax:  [phone]

## 2014-06-28 DIAGNOSIS — G8929 Other chronic pain: Secondary | ICD-10-CM | POA: Insufficient documentation

## 2014-06-28 DIAGNOSIS — R251 Tremor, unspecified: Secondary | ICD-10-CM | POA: Insufficient documentation

## 2014-06-28 DIAGNOSIS — G43119 Migraine with aura, intractable, without status migrainosus: Secondary | ICD-10-CM | POA: Insufficient documentation

## 2014-07-05 ENCOUNTER — Ambulatory Visit: Payer: 59 | Attending: Rehabilitation | Admitting: Physical Therapy

## 2014-07-05 DIAGNOSIS — R293 Abnormal posture: Secondary | ICD-10-CM

## 2014-07-05 DIAGNOSIS — M5022 Other cervical disc displacement, mid-cervical region: Secondary | ICD-10-CM | POA: Insufficient documentation

## 2014-07-05 DIAGNOSIS — M50223 Other cervical disc displacement at C6-C7 level: Secondary | ICD-10-CM

## 2014-07-05 DIAGNOSIS — M6248 Contracture of muscle, other site: Secondary | ICD-10-CM | POA: Insufficient documentation

## 2014-07-05 DIAGNOSIS — M62838 Other muscle spasm: Secondary | ICD-10-CM

## 2014-07-05 DIAGNOSIS — M546 Pain in thoracic spine: Secondary | ICD-10-CM | POA: Insufficient documentation

## 2014-07-05 NOTE — Therapy (Signed)
[place], [place], [address] Phone: [phone]   Fax:  [phone]  Physical Therapy Treatment  Patient Details  Name: Melody Sanchez MRN: [id number] Date of Birth: February 17, 1971 Referring Provider:  Zonia Kief, MD  Encounter Date: 07/05/2014      PT End of Session - 07/05/14 0847    Visit Number 7   Number of Visits 12   Date for PT Re-Evaluation 07/19/14   PT Start Time 0800   PT Stop Time 0901   PT Time Calculation (min) 61 min   Activity Tolerance Patient tolerated treatment well   Behavior During Therapy [facility] for tasks assessed/performed      No past medical history on file.  No past surgical history on file.  There were no vitals taken for this visit.  Visit Diagnosis:  Herniated nucleus pulposus, C6-7  Bilateral thoracic back pain  Muscle spasms of neck  Abnormal posture      Subjective Assessment - 07/05/14 0807    Symptoms Pt 2-3/10 pain and pain is centralizing in neck rarely down arm,  pt benefitting form trigger point dry needling   Limitations House hold activities;Sitting  doing ADL and driving   How long can you sit comfortably? 1 and 1/2 hours   Currently in Pain? Yes   Pain Score 3    Pain Location Neck   Pain Orientation Right   Pain Descriptors / Indicators Aching   Pain Type Chronic pain   Pain Radiating Towards no longer radiating down arem          [facility] PT Assessment - 07/05/14 0809    AROM   Right Shoulder Flexion 148 Degrees   Right Shoulder ABduction 150 Degrees   Right Shoulder Internal Rotation 50 Degrees   Right Shoulder External Rotation 90 Degrees   Left Shoulder Flexion 150 Degrees   Left Shoulder ABduction 150 Degrees   Left Shoulder Internal Rotation 45 Degrees   Left Shoulder External Rotation 90 Degrees   Cervical Flexion 50   Cervical Extension 55   Cervical - Right Side Bend 54   Cervical - Left Side Bend 57   Cervical - Right  Rotation 85   Cervical - Left Rotation 82   Strength   Right Shoulder Flexion 4/5   Right Shoulder ABduction 4+/5   Right Shoulder Internal Rotation 4-/5   Right Shoulder External Rotation 4+/5   Left Shoulder Flexion 4+/5   Left Shoulder Extension 4+/5   Left Shoulder ABduction 4+/5   Left Shoulder Internal Rotation 4/5   Left Shoulder External Rotation 4/5   Right Elbow Flexion 4+/5   Right Elbow Extension 4+/5   Left Elbow Flexion 5/5   Left Elbow Extension 5/5                  [facility] Adult PT Treatment/Exercise - 07/05/14 0809    Posture/Postural Control   Posture/Postural Control Postural limitations   Postural Limitations Rounded Shoulders;Forward head   Posture Comments Pt given handout for ADL's and body mechanics and instructed on care of neck   Neck Exercises: Theraband   Shoulder Extension 10 reps;Red  x2 VC for shld position   Rows 10 reps  x2 VC for shld position   Shoulder Exercises: Supine   Other Supine Exercises chin nods 10 x 2   Other Supine Exercises chin tuck with arms flexed and circles 10 x 2    Moist Heat Therapy   Number Minutes Moist  Heat 15 Minutes   Moist Heat Location --  neck          Trigger Point Dry Needling - 07/05/14 6468    Consent Given? Yes   Education Handout Provided No  previously given   Muscles Treated Upper Body --  erector spinae C-3 to C-6 Right   Upper Trapezius Response Twitch reponse elicited;Palpable increased muscle length   Levator Scapulae Response Twitch response elicited;Palpable increased muscle length   Rhomboids Response Palpable increased muscle length              PT Education - 07/05/14 0847    Education Details Pt given handout for ADl's /posture body mechanics and reviewed HEP   Person(s) Educated Patient   Methods Explanation;Demonstration;Verbal cues;Handout   Comprehension Verbalized understanding;Returned demonstration          PT Short Term Goals - 07/05/14 0817    PT  SHORT TERM GOAL #1   Title "Independent with initial HEP   Time 3   Period Weeks   Status Achieved   PT SHORT TERM GOAL #2   Title "Report pain decrease from10 /10 to 6/10   Time 3   Period Weeks   Status Achieved   PT SHORT TERM GOAL #3   Title "Demonstrate understanding of proper sitting posture, body mechanics, work ergonomics, and be more conscious of position and posture throughout the day   Time 3   Period Weeks   Status Achieved           PT Long Term Goals - 07/05/14 0820    PT LONG TERM GOAL #1   Title "Demonstrate and verbalize techniques to reduce the risk of re-injury including: lifting, posture, body mechanics.    Time 6   Period Weeks   Status On-going   PT LONG TERM GOAL #2   Title "Pt will be independent with advanced HEP.    Time 6   Period Weeks   Status On-going   PT LONG TERM GOAL #3   Title "FOTO will improve from  97%  to   56%  indicating improved functional mobility   Time 6   Period Weeks   PT LONG TERM GOAL #4   Title "Pt will tolerate sitting 1 hour without increased pain to ride in car without increased pain.  Pt tolerating 1 and 1/2 hours of sitting with 3/10 pain   Time 6   Period Weeks   Status Achieved   PT LONG TERM GOAL #5   Title Pt will tolerate working on computer for two hours without increased pain to return to work at [employer]  only 1 1/2 hours but improving   Time 6   Period Weeks   Status --   Additional Long Term Goals   Additional Long Term Goals Yes   PT LONG TERM GOAL #6   Title "Pain will decrease to 3/10 or less with all functional activities   Time 6   Period Weeks   Status Achieved   PT LONG TERM GOAL #7   Title Pain with decrease to 1/10 or less with all functional activities including ADL' and household chores and driving   Time 6   Period Weeks   Status New               Plan - 07/05/14 0848    Clinical Impression Statement Pt making good progress with goals. with increased AROM and decreased pain  with neck radiating pain centralizing to 2/3 out  or 10 into neck.  Pt achieved LTG of pain with functional acitivities 3/10 or less.   Pt will continue PT with revised goal of 1/0 pain and ability to function for at least 2 hours or more sitting for work.  Pt has been incorporating good posture and body mechanics into daily life .  will continue for completion of goals   Pt will benefit from skilled therapeutic intervention in order to improve on the following deficits Decreased activity tolerance;Decreased mobility;Decreased range of motion;Decreased strength;Decreased endurance;Increased fascial restricitons;Increased muscle spasms;Pain;Impaired UE functional use;Impaired sensation;Impaired flexibility;Improper body mechanics;Postural dysfunction   Rehab Potential Good   PT Frequency 2x / week   PT Duration 6 weeks   PT Treatment/Interventions ADLs/Self Care Home Management;Cryotherapy;Electrical Stimulation;Ultrasound;Traction;Moist Heat;Neuromuscular re-education;Therapeutic exercise;Therapeutic activities;Patient/family education;Manual techniques;Dry needling;Passive range of motion   PT Next Visit Plan continue Dry needling        Problem List There are no active problems to display for this patient.  Voncille Lo, PT 07/05/2014 12:14 PM Phone: [phone] Fax: [facility] [place], [place], [address] Phone: [phone]   Fax:  [phone]

## 2014-07-05 NOTE — Patient Instructions (Signed)
Reviewed HEP  Shoulder Rows 10 x 2 Shoulder Extension with REd Theraband Chin nods lying on back Chin nods with arms flexed and circles  Voncille Lo, PT 07/05/2014 8:17 AM Phone: [phone] Fax: [phone]

## 2014-07-07 ENCOUNTER — Ambulatory Visit: Payer: 59 | Admitting: Rehabilitation

## 2014-07-07 DIAGNOSIS — M50223 Other cervical disc displacement at C6-C7 level: Secondary | ICD-10-CM

## 2014-07-07 DIAGNOSIS — M62838 Other muscle spasm: Secondary | ICD-10-CM

## 2014-07-07 DIAGNOSIS — M546 Pain in thoracic spine: Secondary | ICD-10-CM

## 2014-07-07 DIAGNOSIS — R293 Abnormal posture: Secondary | ICD-10-CM

## 2014-07-07 DIAGNOSIS — M5022 Other cervical disc displacement, mid-cervical region: Secondary | ICD-10-CM | POA: Diagnosis not present

## 2014-07-07 NOTE — Therapy (Signed)
[place], [place], [address] Phone: [phone]   Fax:  [phone]  Physical Therapy Treatment  Patient Details  Name: Melody Sanchez MRN: [id number] Date of Birth: 29-Jun-1970 Referring Provider:  Zonia Kief, MD  Encounter Date: 07/07/2014      PT End of Session - 07/07/14 0806    Visit Number 8   Number of Visits 12   Date for PT Re-Evaluation 07/19/14   PT Start Time 0800   PT Stop Time 0045   PT Time Calculation (min) 57 min      No past medical history on file.  No past surgical history on file.  There were no vitals taken for this visit.  Visit Diagnosis:  Muscle spasms of neck  Herniated nucleus pulposus, C6-7  Bilateral thoracic back pain  Abnormal posture      Subjective Assessment - 07/07/14 0803    Symptoms I sat awhile yesterday during daughters basketball practice. May be why I am hurting. I was hurting more in my neck after the dry needling.    Currently in Pain? Yes   Pain Score 3    Pain Location Neck   Pain Orientation Mid   Pain Descriptors / Indicators Aching   Pain Type Chronic pain   Pain Frequency Intermittent   Aggravating Factors  sitting greater than one hour. Carrying laundry and grocerries.    Pain Relieving Factors rest          [facility] PT Assessment - 07/07/14 0811    AROM   Right Shoulder Flexion 148 Degrees   Right Shoulder ABduction 150 Degrees   Right Shoulder Internal Rotation 50 Degrees   Right Shoulder External Rotation 90 Degrees   Left Shoulder Flexion 150 Degrees   Left Shoulder ABduction 150 Degrees   Left Shoulder Internal Rotation 45 Degrees   Left Shoulder External Rotation 90 Degrees   Cervical Flexion 50   Cervical Extension 55   Cervical - Right Side Bend 55   Cervical - Left Side Bend 55   Cervical - Right Rotation 80   Cervical - Left Rotation 80                  [facility] Adult PT Treatment/Exercise - 07/07/14  0814    Neck Exercises: Machines for Strengthening   UBE (Upper Arm Bike) Level 2 x 3 min forward, 3 min back   Neck Exercises: Theraband   Shoulder Extension 10 reps;Red  2 sets    Rows 10 reps;Red  2 sets   Horizontal ABduction 20 reps;Red   Other Theraband Exercises Diagonals x 10 each red band   Neck Exercises: Supine   Neck Retraction 10 reps  with arm circles and horiz abdct/addct x 10 each   Neck Exercises: Prone   Other Prone Exercise Prone scap retract with shoulder ext x 10, horiz abdct x 10, Bent T x 10, Y x 10   Electrical Stimulation   Electrical Stimulation Location cervical and upper back   Electrical Stimulation Action IFC   Electrical Stimulation Parameters 9   Electrical Stimulation Goals Pain                  PT Short Term Goals - 07/05/14 0817    PT SHORT TERM GOAL #1   Title "Independent with initial HEP   Time 3   Period Weeks   Status Achieved   PT SHORT TERM GOAL #2   Title "Report pain decrease  from10 /10 to 6/10   Time 3   Period Weeks   Status Achieved   PT SHORT TERM GOAL #3   Title "Demonstrate understanding of proper sitting posture, body mechanics, work ergonomics, and be more conscious of position and posture throughout the day   Time 3   Period Weeks   Status Achieved           PT Long Term Goals - 07/07/14 8159    PT LONG TERM GOAL #1   Title "Demonstrate and verbalize techniques to reduce the risk of re-injury including: lifting, posture, body mechanics.    Time 6   Period Weeks   Status On-going   PT LONG TERM GOAL #2   Title "Pt will be independent with advanced HEP.    Time 6   Period Weeks   Status On-going   PT LONG TERM GOAL #3   Title "FOTO will improve from  97%  to   56%  indicating improved functional mobility   Time 6   Period Weeks   Status On-going   PT LONG TERM GOAL #4   Title "Pt will tolerate sitting 1 hour without increased pain to ride in car without increased pain.  pt toelrating 1.5 hours  sitting in car with 3/10 pain   Time 6   Period Weeks   Status Achieved   PT LONG TERM GOAL #5   Title Pt will tolerate working on computer for two hours without increased pain to return to work at [employer]   Time 6   Period Weeks   Status On-going   PT LONG TERM GOAL #6   Title "Pain will decrease to 3/10 or less with all functional activities   Time 6   Period Weeks   Status Achieved   PT LONG TERM GOAL #7   Title Pain with decrease to 1/10 or less with all functional activities including ADL' and household chores and driving   Time 6   Period Weeks   Status On-going               Plan - 07/07/14 0817    Clinical Impression Statement She goals MET. Pt reports she is about the same since last session of dry needling and may have felt increases pain. Overall she has increased AROM and decreased neck pain. She reports no longer having radicular symptoms. Continued pain with ADLs and sitting activities.    PT Next Visit Plan Continue toward completion of goals. See what MD says        Problem List There are no active problems to display for this patient.   Dorene Ar, [place] 07/07/2014, 8:50 AM  [facility] [address] [place], [place], [address] Phone: [phone]   Fax:  [phone]

## 2014-07-12 ENCOUNTER — Ambulatory Visit: Payer: 59 | Admitting: Rehabilitation

## 2014-07-12 DIAGNOSIS — M50223 Other cervical disc displacement at C6-C7 level: Secondary | ICD-10-CM

## 2014-07-12 DIAGNOSIS — M62838 Other muscle spasm: Secondary | ICD-10-CM

## 2014-07-12 DIAGNOSIS — R293 Abnormal posture: Secondary | ICD-10-CM

## 2014-07-12 DIAGNOSIS — M5022 Other cervical disc displacement, mid-cervical region: Secondary | ICD-10-CM | POA: Diagnosis not present

## 2014-07-12 DIAGNOSIS — M546 Pain in thoracic spine: Secondary | ICD-10-CM

## 2014-07-12 NOTE — Therapy (Signed)
[place], [place], [address] Phone: [phone]   Fax:  [phone]  Physical Therapy Treatment  Patient Details  Name: Melody Sanchez MRN: [id number] Date of Birth: 1970-11-27 Referring Provider:  Zonia Kief, MD  Encounter Date: 07/12/2014      PT End of Session - 07/12/14 0744    Visit Number 9   Number of Visits 12   Date for PT Re-Evaluation 07/19/14   PT Start Time 0736      No past medical history on file.  No past surgical history on file.  There were no vitals taken for this visit.  Visit Diagnosis:  Herniated nucleus pulposus, C6-7  Bilateral thoracic back pain  Abnormal posture  Muscle spasms of neck      Subjective Assessment - 07/12/14 0742    Symptoms I am feeling good this morning. 1-2/10 pain posterior neck. I have been out of town for several days checking on my grandmother. I hope to return to work half days next week.   Aggravating Factors  sitting, carrying   Pain Relieving Factors rest                    [facility] Adult PT Treatment/Exercise - 07/12/14 0757    Neck Exercises: Machines for Strengthening   UBE (Upper Arm Bike) Level 2 x 3 min forward, 3 min back   Neck Exercises: Theraband   Shoulder Extension 15 reps;Green   Rows 15 reps;Green   Neck Exercises: Supine   Neck Retraction 10 reps  with arm circles and horiz abdct/addct x 10 each   Other Supine Exercise Supine cervical stab level 2 with 2 # weight x 10 each, pt demonstrates poor motor control   Other Supine Exercise bilateral ER supine with red band and chin tuck 10x2                  PT Short Term Goals - 07/05/14 0817    PT SHORT TERM GOAL #1   Title "Independent with initial HEP   Time 3   Period Weeks   Status Achieved   PT SHORT TERM GOAL #2   Title "Report pain decrease from10 /10 to 6/10   Time 3   Period Weeks   Status Achieved   PT SHORT TERM GOAL #3   Title  "Demonstrate understanding of proper sitting posture, body mechanics, work ergonomics, and be more conscious of position and posture throughout the day   Time 3   Period Weeks   Status Achieved           PT Long Term Goals - 07/12/14 0745    PT LONG TERM GOAL #1   Title "Demonstrate and verbalize techniques to reduce the risk of re-injury including: lifting, posture, body mechanics.    Time 6   Period Weeks   Status On-going   PT LONG TERM GOAL #2   Title "Pt will be independent with advanced HEP.    Period Weeks   Status On-going   PT LONG TERM GOAL #3   Title "FOTO will improve from  97%  to   56%  indicating improved functional mobility   Time 6   Period Weeks   Status On-going   PT LONG TERM GOAL #4   Title "Pt will tolerate sitting 1 hour without increased pain to ride in car without increased pain.   Time 6   Period Weeks   Status Achieved  able  to tolerate 3 hours with increase to 3/10 pain   PT LONG TERM GOAL #5   Title Pt will tolerate working on computer for two hours without increased pain to return to work at [employer]   Time 6   Period Weeks   Status Unable to assess   PT LONG TERM GOAL #6   Title "Pain will decrease to 3/10 or less with all functional activities   Time 6   Period Weeks   Status Achieved   PT LONG TERM GOAL #7   Title Pain with decrease to 1/10 or less with all functional activities including ADL' and household chores and driving   Time 6   Period Weeks   Status On-going               Plan - 07/12/14 8101    Clinical Impression Statement Pt declined modalities at end of treatment and reported no increased pain. MD has approved return to work for  4 hour days for next 2 weeks. Then increase from there.  Pt reports pain remains at a 3/10 or less with functional activities. She has not returned to work hoever is hoping to next week.    PT Next Visit Plan Body Mechanics, review HEP, FOTO        Problem List There are no active  problems to display for this patient.   Hessie Diener [place], [place] 07/12/2014, 8:23 AM  [facility] [address] [place], [place], [address] Phone: [phone]   Fax:  [phone]

## 2014-07-13 ENCOUNTER — Ambulatory Visit: Payer: 59 | Admitting: Rehabilitation

## 2014-07-13 DIAGNOSIS — M546 Pain in thoracic spine: Secondary | ICD-10-CM

## 2014-07-13 DIAGNOSIS — M50223 Other cervical disc displacement at C6-C7 level: Secondary | ICD-10-CM

## 2014-07-13 DIAGNOSIS — M62838 Other muscle spasm: Secondary | ICD-10-CM

## 2014-07-13 DIAGNOSIS — R293 Abnormal posture: Secondary | ICD-10-CM

## 2014-07-13 DIAGNOSIS — M5022 Other cervical disc displacement, mid-cervical region: Secondary | ICD-10-CM | POA: Diagnosis not present

## 2014-07-13 NOTE — Therapy (Addendum)
[place], [place], [address] Phone: [phone]   Fax:  [phone]  Physical Therapy Treatment/Discharge Note  Patient Details  Name: LORALEE WEITZMAN MRN: [id number] Date of Birth: July 18, 1970 Referring Provider:  Zonia Kief, MD  Encounter Date: 07/13/2014      PT End of Session - 07/13/14 0804    Visit Number 10   Number of Visits 12   Date for PT Re-Evaluation 07/19/14   PT Start Time 0800   PT Stop Time 0850   PT Time Calculation (min) 50 min      No past medical history on file.  No past surgical history on file.  There were no vitals taken for this visit.  Visit Diagnosis:  Herniated nucleus pulposus, C6-7  Bilateral thoracic back pain  Abnormal posture  Muscle spasms of neck      Subjective Assessment - 07/13/14 0802    Symptoms 3/10 pain in posterior neck today. I did a lot of walking around my neighborhood and my legs are sore from doing squats.    Currently in Pain? Yes   Pain Score 3    Pain Location Neck   Pain Orientation Posterior;Mid;Lower   Pain Descriptors / Indicators Aching   Pain Type Chronic pain   Pain Onset More than a month ago   Pain Frequency Intermittent          [facility] PT Assessment - 07/13/14 0001    Observation/Other Assessments   Focus on Therapeutic Outcomes (FOTO)  FOTO limitation improved from 97% to 48% (predicted 56 %)                  [facility] Adult PT Treatment/Exercise - 07/13/14 0001    Neck Exercises: Theraband   Shoulder Extension 20 reps;Green   Rows 20 reps;Green   Horizontal ABduction 10 reps;Green   Horizontal ABduction Limitations standing   Other Theraband Exercises Diagonals x 10 each green band   Neck Exercises: Supine   Neck Retraction 10 reps  with arm circles and horiz abdct/addct x 10 each   Other Supine Exercise Supine cervical stab level 2 with 2 # weight x 20 each, pt demonstrates poor motor control   Other  Supine Exercise bilateral ER supine with green band and chin tuck 10x2   Neck Exercises: Prone   Other Prone Exercise Prone scap retract with shoulder ext x 10, horiz abdct x 10, Bent T x 10, Y x 10                  PT Short Term Goals - 07/05/14 3893    PT SHORT TERM GOAL #1   Title "Independent with initial HEP   Time 3   Period Weeks   Status Achieved   PT SHORT TERM GOAL #2   Title "Report pain decrease from10 /10 to 6/10   Time 3   Period Weeks   Status Achieved   PT SHORT TERM GOAL #3   Title "Demonstrate understanding of proper sitting posture, body mechanics, work ergonomics, and be more conscious of position and posture throughout the day   Time 3   Period Weeks   Status Achieved           PT Long Term Goals - 07/13/14 0840    PT LONG TERM GOAL #1   Title "Demonstrate and verbalize techniques to reduce the risk of re-injury including: lifting, posture, body mechanics.    Time 6   Period Weeks  Status On-going   PT LONG TERM GOAL #2   Title "Pt will be independent with advanced HEP.    Time 6   Period Weeks   Status On-going   PT LONG TERM GOAL #3   Title "FOTO will improve from  97%  to   56%  indicating improved functional mobility   Time 6   Period Weeks   Status Achieved   PT LONG TERM GOAL #4   Title "Pt will tolerate sitting 1 hour without increased pain to ride in car without increased pain.   Time 6   Period Weeks   Status Achieved  able to tolerate 3 hours with pain up to 3/10   PT LONG TERM GOAL #5   Title Pt will tolerate working on computer for two hours without increased pain to return to work at [employer]   Time 6   Period Weeks   Status Unable to assess   PT LONG TERM GOAL #6   Title "Pain will decrease to 3/10 or less with all functional activities   Time 6   Period Weeks   Status Achieved   PT LONG TERM GOAL #7   Title Pain with decrease to 1/10 or less with all functional activities including ADL' and household chores and  driving   Time 6   Period Weeks   Status On-going               Plan - 07/13/14 0835    Clinical Impression Statement Pt returns to work for 4 hour days starting tomorrow. Her pain remain 3/10 or less. She has returned to light exercise with pain staying 3/10 or less. FOTO limitation decreased from 97% limited to 48% limited.    PT Next Visit Plan Body Mechanics, review HEP, discharge        Problem List There are no active problems to display for this patient.   Hessie Diener [place], [place] 07/13/2014, 8:42 AM  [facility] [address] [place], [place], [address] Phone: [phone]   Fax:  [phone]     PHYSICAL THERAPY DISCHARGE SUMMARY  Visits from Start of Care: 10  Current functional level related to goals / functional outcomes: Unknown.  Last 3/10 pain   Remaining deficits: See above.  Unknown at present   Education / Equipment: HEP Plan: Patient agrees to discharge.  Patient goals were partially met. Patient is being discharged due to not returning since the last visit.  ?????       Voncille Lo, PT 03/27/2015 4:22 PM Phone: [phone] Fax: [phone]

## 2014-07-19 ENCOUNTER — Ambulatory Visit: Payer: 59 | Admitting: Rehabilitation

## 2015-12-05 ENCOUNTER — Other Ambulatory Visit: Payer: Self-pay | Admitting: Obstetrics & Gynecology

## 2015-12-05 DIAGNOSIS — Z1231 Encounter for screening mammogram for malignant neoplasm of breast: Secondary | ICD-10-CM

## 2015-12-08 ENCOUNTER — Ambulatory Visit
Admission: RE | Admit: 2015-12-08 | Discharge: 2015-12-08 | Disposition: A | Discharge status: Home / Self Care | Payer: 59 | Source: Ambulatory Visit | Attending: Obstetrics & Gynecology | Admitting: Obstetrics & Gynecology

## 2015-12-08 DIAGNOSIS — Z1231 Encounter for screening mammogram for malignant neoplasm of breast: Secondary | ICD-10-CM

## 2015-12-11 ENCOUNTER — Other Ambulatory Visit: Payer: Self-pay | Admitting: Obstetrics & Gynecology

## 2015-12-11 DIAGNOSIS — N63 Unspecified lump in unspecified breast: Secondary | ICD-10-CM

## 2016-01-09 ENCOUNTER — Ambulatory Visit
Admission: RE | Admit: 2016-01-09 | Discharge: 2016-01-09 | Disposition: A | Discharge status: Home / Self Care | Payer: 59 | Source: Ambulatory Visit | Attending: Obstetrics & Gynecology | Admitting: Obstetrics & Gynecology

## 2016-01-09 DIAGNOSIS — N63 Unspecified lump in unspecified breast: Secondary | ICD-10-CM

## 2016-01-09 LAB — HM MAMMOGRAPHY

## 2016-01-10 ENCOUNTER — Other Ambulatory Visit: Payer: Self-pay | Admitting: Obstetrics & Gynecology

## 2016-01-10 DIAGNOSIS — N63 Unspecified lump in unspecified breast: Secondary | ICD-10-CM

## 2016-01-15 ENCOUNTER — Ambulatory Visit
Admission: RE | Admit: 2016-01-15 | Discharge: 2016-01-15 | Disposition: A | Discharge status: Home / Self Care | Payer: 59 | Source: Ambulatory Visit | Attending: Obstetrics & Gynecology | Admitting: Obstetrics & Gynecology

## 2016-01-15 DIAGNOSIS — N63 Unspecified lump in unspecified breast: Secondary | ICD-10-CM

## 2016-10-31 ENCOUNTER — Other Ambulatory Visit: Payer: Self-pay | Admitting: Obstetrics & Gynecology

## 2016-11-04 LAB — HM PAP SMEAR

## 2017-02-08 DIAGNOSIS — S30820A Blister (nonthermal) of lower back and pelvis, initial encounter: Secondary | ICD-10-CM | POA: Insufficient documentation

## 2017-04-08 NOTE — Patient Instructions (Addendum)
Your procedure is scheduled on:  Wednesday, Dec 26  Enter through the [employer] [facility] at: 9:30 am  Pick up the phone at the desk and dial [phone].  Call this number if you have problems the morning of surgery: [phone].  Remember: Do NOT eat food or Do NOT drink clear liquids (including water) after midnight Tuesday.  Take these medicines the morning of surgery with a SIP OF WATER: cymbalta, gabapentin  Bring inhaler with you on day of surgery.  Do Not smoke on the day of surgery.  Stop herbal medications and supplements at this time.  Do NOT wear jewelry (body piercing), metal hair clips/bobby pins, make-up, or nail polish. Do NOT wear lotions, powders, or perfumes.  You may wear deoderant. Do NOT shave for 48 hours prior to surgery. Do NOT bring valuables to the hospital. Contacts, dentures, or bridgework may not be worn into surgery.  Leave suitcase in car.  After surgery it may be brought to your room.  For patients admitted to the hospital, checkout time is 11:00 AM the day of discharge. Have a responsible adult drive you home and stay with you for 24 hours after your procedure.

## 2017-04-11 ENCOUNTER — Ambulatory Visit (INDEPENDENT_AMBULATORY_CARE_PROVIDER_SITE_OTHER): Payer: 59 | Admitting: Family Medicine

## 2017-04-11 ENCOUNTER — Encounter: Payer: Self-pay | Admitting: Family Medicine

## 2017-04-11 VITALS — BP 126/90 | Temp 98.7°F | Ht 61.5 in | Wt 169.6 lb

## 2017-04-11 DIAGNOSIS — Z23 Encounter for immunization: Secondary | ICD-10-CM | POA: Diagnosis not present

## 2017-04-11 DIAGNOSIS — H6122 Impacted cerumen, left ear: Secondary | ICD-10-CM | POA: Diagnosis not present

## 2017-04-11 DIAGNOSIS — Z13220 Encounter for screening for lipoid disorders: Secondary | ICD-10-CM | POA: Diagnosis not present

## 2017-04-11 DIAGNOSIS — Z01818 Encounter for other preprocedural examination: Secondary | ICD-10-CM | POA: Diagnosis not present

## 2017-04-11 DIAGNOSIS — R000 Tachycardia, unspecified: Secondary | ICD-10-CM | POA: Diagnosis not present

## 2017-04-11 DIAGNOSIS — F419 Anxiety disorder, unspecified: Secondary | ICD-10-CM

## 2017-04-11 DIAGNOSIS — M542 Cervicalgia: Secondary | ICD-10-CM | POA: Diagnosis not present

## 2017-04-11 DIAGNOSIS — D219 Benign neoplasm of connective and other soft tissue, unspecified: Secondary | ICD-10-CM | POA: Diagnosis not present

## 2017-04-11 LAB — BASIC METABOLIC PANEL
BUN: 13 mg/dL (ref 6–23)
CO2: 25 mEq/L (ref 19–32)
Calcium: 9.3 mg/dL (ref 8.4–10.5)
Chloride: 102 mEq/L (ref 96–112)
Creatinine, Ser: 0.78 mg/dL (ref 0.40–1.20)
GFR: 101.07 mL/min (ref >60.00)
Glucose, Bld: 78 mg/dL (ref 70–99)
Potassium: 4.1 mEq/L (ref 3.5–5.1)
Sodium: 137 mEq/L (ref 135–145)

## 2017-04-11 LAB — LIPID PANEL
Cholesterol: 239 mg/dL — ABNORMAL HIGH (ref 0–200)
HDL: 74.6 mg/dL (ref >39.00)
LDL Cholesterol: 142 mg/dL — ABNORMAL HIGH (ref 0–99)
NonHDL: 164.58
Total CHOL/HDL Ratio: 3
Triglycerides: 113 mg/dL (ref 0.0–149.0)
VLDL: 22.6 mg/dL (ref 0.0–40.0)

## 2017-04-11 LAB — CBC WITH DIFFERENTIAL/PLATELET
Basophils Absolute: 0 10*3/uL (ref 0.0–0.1)
Basophils Relative: 0.8 % (ref 0.0–3.0)
Eosinophils Absolute: 0.1 10*3/uL (ref 0.0–0.7)
Eosinophils Relative: 2 % (ref 0.0–5.0)
HCT: 41.6 % (ref 36.0–46.0)
Hemoglobin: 13.4 g/dL (ref 12.0–15.0)
Lymphocytes Relative: 35.6 % (ref 12.0–46.0)
Lymphs Abs: 1.6 10*3/uL (ref 0.7–4.0)
MCHC: 32.2 g/dL (ref 30.0–36.0)
MCV: 87.3 fl (ref 78.0–100.0)
Monocytes Absolute: 0.4 10*3/uL (ref 0.1–1.0)
Monocytes Relative: 9.3 % (ref 3.0–12.0)
Neutro Abs: 2.4 10*3/uL (ref 1.4–7.7)
Neutrophils Relative %: 52.3 % (ref 43.0–77.0)
Platelets: 366 10*3/uL (ref 150.0–400.0)
RBC: 4.76 Mil/uL (ref 3.87–5.11)
RDW: 15.7 % — ABNORMAL HIGH (ref 11.5–15.5)
WBC: 4.6 10*3/uL (ref 4.0–10.5)

## 2017-04-11 LAB — TSH: TSH: 1.2 u[IU]/mL (ref 0.35–4.50)

## 2017-04-11 LAB — T4, FREE: Free T4: 0.84 ng/dL (ref 0.60–1.60)

## 2017-04-11 MED ORDER — DULOXETINE HCL 30 MG PO CPEP: ORAL_CAPSULE | ORAL | 1 refills | Status: DC

## 2017-04-11 NOTE — Patient Instructions (Addendum)
Living With Anxiety After being diagnosed with an anxiety disorder, you may be relieved to know why you have felt or behaved a certain way. It is natural to also feel overwhelmed about the treatment ahead and what it will mean for your life. With care and support, you can manage this condition and recover from it. How to cope with anxiety Dealing with stress Stress is your body's reaction to life changes and events, both good and bad. Stress can last just a few hours or it can be ongoing. Stress can play a major role in anxiety, so it is important to learn both how to cope with stress and how to think about it differently. Talk with your health care provider or a counselor to learn more about stress reduction. He or she may suggest some stress reduction techniques, such as:  Music therapy. This can include creating or listening to music that you enjoy and that inspires you.  Mindfulness-based meditation. This involves being aware of your normal breaths, rather than trying to control your breathing. It can be done while sitting or walking.  Centering prayer. This is a kind of meditation that involves focusing on a word, phrase, or sacred image that is meaningful to you and that brings you peace.  Deep breathing. To do this, expand your stomach and inhale slowly through your nose. Hold your breath for 3-5 seconds. Then exhale slowly, allowing your stomach muscles to relax.  Self-talk. This is a skill where you identify thought patterns that lead to anxiety reactions and correct those thoughts.  Muscle relaxation. This involves tensing muscles then relaxing them.  Choose a stress reduction technique that fits your lifestyle and personality. Stress reduction techniques take time and practice. Set aside 5-15 minutes a day to do them. Therapists can offer training in these techniques. The training may be covered by some insurance plans. Other things you can do to manage stress include:  Keeping a  stress diary. This can help you learn what triggers your stress and ways to control your response.  Thinking about how you respond to certain situations. You may not be able to control everything, but you can control your reaction.  Making time for activities that help you relax, and not feeling guilty about spending your time in this way.  Therapy combined with coping and stress-reduction skills provides the best chance for successful treatment. Medicines Medicines can help ease symptoms. Medicines for anxiety include:  Anti-anxiety drugs.  Antidepressants.  Beta-blockers.  Medicines may be used as the main treatment for anxiety disorder, along with therapy, or if other treatments are not working. Medicines should be prescribed by a health care provider. Relationships Relationships can play a big part in helping you recover. Try to spend more time connecting with trusted friends and family members. Consider going to couples counseling, taking family education classes, or going to family therapy. Therapy can help you and others better understand the condition. How to recognize changes in your condition Everyone has a different response to treatment for anxiety. Recovery from anxiety happens when symptoms decrease and stop interfering with your daily activities at home or work. This may mean that you will start to:  Have better concentration and focus.  Sleep better.  Be less irritable.  Have more energy.  Have improved memory.  It is important to recognize when your condition is getting worse. Contact your health care provider if your symptoms interfere with home or work and you do not feel like your condition   is improving. Where to find help and support: You can get help and support from these sources:  Self-help groups.  Online and [employer].  A trusted spiritual leader.  Couples counseling.  Family education classes.  Family therapy.  Follow these  instructions at home:  Eat a healthy diet that includes plenty of vegetables, fruits, whole grains, low-fat dairy products, and lean protein. Do not eat a lot of foods that are high in solid fats, added sugars, or salt.  Exercise. Most adults should do the following: ? Exercise for at least 150 minutes each week. The exercise should increase your heart rate and make you sweat (moderate-intensity exercise). ? Strengthening exercises at least twice a week.  Cut down on caffeine, tobacco, alcohol, and other potentially harmful substances.  Get the right amount and quality of sleep. Most adults need 7-9 hours of sleep each night.  Make choices that simplify your life.  Take over-the-counter and prescription medicines only as told by your health care provider.  Avoid caffeine, alcohol, and certain over-the-counter cold medicines. These may make you feel worse. Ask your pharmacist which medicines to avoid.  Keep all follow-up visits as told by your health care provider. This is important. Questions to ask your health care provider  Would I benefit from therapy?  How often should I follow up with a health care provider?  How long do I need to take medicine?  Are there any long-term side effects of my medicine?  Are there any alternatives to taking medicine? Contact a health care provider if:  You have a hard time staying focused or finishing daily tasks.  You spend many hours a day feeling worried about everyday life.  You become exhausted by worry.  You start to have headaches, feel tense, or have nausea.  You urinate more than normal.  You have diarrhea. Get help right away if:  You have a racing heart and shortness of breath.  You have thoughts of hurting yourself or others. If you ever feel like you may hurt yourself or others, or have thoughts about taking your own life, get help right away. You can go to your nearest emergency department or call:  Your local emergency  services (911 in the [place]  A suicide crisis helpline, such as the [facility] at 865-271-0103. This is open 24-hours a day.  Summary  Taking steps to deal with stress can help calm you.  Medicines cannot cure anxiety disorders, but they can help ease symptoms.  Family, friends, and partners can play a big part in helping you recover from an anxiety disorder. This information is not intended to replace advice given to you by your health care provider. Make sure you discuss any questions you have with your health care provider. Document Released: 04/16/2016 Document Revised: 04/16/2016 Document Reviewed: 04/16/2016 Elsevier Interactive Patient Education  2018 [employer].  Sinus Tachycardia Sinus tachycardia is a kind of fast heartbeat. In sinus tachycardia, the heart beats more than 100 times a minute. Sinus tachycardia starts in a part of the heart called the sinus node. Sinus tachycardia may be harmless, or it may be a sign of a serious condition. What are the causes? This condition may be caused by:  Exercise or exertion.  A fever.  Pain.  Loss of body fluids (dehydration).  Severe bleeding (hemorrhage).  Anxiety and stress.  Certain substances, including: ? Alcohol. ? Caffeine. ? Tobacco and nicotine products. ? Diet pills. ? Illegal drugs.  Medical conditions  including: ? Heart disease. ? An infection. ? An overactive thyroid (hyperthyroidism). ? A lack of red blood cells (anemia).  What are the signs or symptoms? Symptoms of this condition include:  A feeling that the heart is beating quickly (palpitations).  Suddenly noticing your heartbeat (cardiac awareness).  Dizziness.  Tiredness (fatigue).  Shortness of breath.  Chest pain.  Nausea.  Fainting.  How is this diagnosed? This condition is diagnosed with:  A physical exam.  Other tests, such as: ? Blood tests. ? An electrocardiogram (ECG). This test measures  the electrical activity of the heart. ? Holter monitoring. For this test, you wear a device that records your heartbeat for one or more days.  You may be referred to a heart specialist (cardiologist). How is this treated? Treatment for this condition depends on the cause or underlying condition. Treatment may involve:  Treating the underlying condition.  Taking new medicines or changing your current medicines as told by your health care provider.  Making changes to your diet or lifestyle.  Practicing relaxation methods.  Follow these instructions at home: Lifestyle  Do not use any products that contain nicotine or tobacco, such as cigarettes and e-cigarettes. If you need help quitting, ask your health care provider.  Learn relaxation methods, like deep breathing, to help you when you get stressed or anxious.  Do not use illegal drugs, such as cocaine.  Do not abuse alcohol. Limit alcohol intake to no more than 1 drink a day for non-pregnant women and 2 drinks a day for men. One drink equals 12 oz of beer, 5 oz of wine, or 1 oz of hard liquor.  Find time to rest and relax often. This reduces stress.  Avoid: ? Caffeine. ? Stimulants such as over-the-counter diet pills or pills that help you to stay awake. ? Situations that cause anxiety or stress. General instructions  Drink enough fluids to keep your urine clear or pale yellow.  Take over-the-counter and prescription medicines only as told by your health care provider.  Keep all follow-up visits as told by your health care provider. This is important. Contact a health care provider if:  You have a fever.  You have vomiting or diarrhea that keeps happening (is persistent). Get help right away if:  You have pain in your chest, upper arms, jaw, or neck.  You become weak or dizzy.  You feel faint.  You have palpitations that do not go away. This information is not intended to replace advice given to you by your health  care provider. Make sure you discuss any questions you have with your health care provider. Document Released: 05/30/2004 Document Revised: 11/18/2015 Document Reviewed: 11/04/2014 Elsevier Interactive Patient Education  [employer].
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
## 2017-04-13 ENCOUNTER — Encounter: Payer: Self-pay | Admitting: Family Medicine

## 2017-04-14 ENCOUNTER — Telehealth: Payer: Self-pay | Admitting: Family Medicine

## 2017-04-14 NOTE — Telephone Encounter (Signed)
Pt requesting to have papers filled out for surgery on this Thursday, 04/17/17.

## 2017-04-15 ENCOUNTER — Other Ambulatory Visit: Payer: Self-pay | Admitting: *Deleted

## 2017-04-15 DIAGNOSIS — E785 Hyperlipidemia, unspecified: Secondary | ICD-10-CM

## 2017-04-16 ENCOUNTER — Ambulatory Visit (INDEPENDENT_AMBULATORY_CARE_PROVIDER_SITE_OTHER): Payer: 59 | Admitting: Family Medicine

## 2017-04-16 ENCOUNTER — Encounter: Payer: Self-pay | Admitting: Family Medicine

## 2017-04-16 DIAGNOSIS — F419 Anxiety disorder, unspecified: Secondary | ICD-10-CM

## 2017-04-16 DIAGNOSIS — R000 Tachycardia, unspecified: Secondary | ICD-10-CM

## 2017-04-16 NOTE — Progress Notes (Signed)
Subjective:    Patient ID: Melody Sanchez, female    DOB: Aug 05, 1970, 46 y.o.   MRN: [id number]  No chief complaint on file.   HPI Patient was seen today for f/u.  At last [facility], pt was trying to establish care and have a form completed for hysterectomy.  Pt was found to be tachycardic.  EKG and labs largely normal.  Pt was also started on Cymbalta for anxiety and has been taking 30 mg.  Pt endorses increased stress as her father is not doing as well as was hoped.  Pt spent the last few days in the hospital with him and was snowed in.  Pt is trying to have hysterectomy done prior to the end of the yr.  Past Medical History:  Diagnosis Date  . Anxiety   . GERD (gastroesophageal reflux disease)   . Migraines     No Known Allergies  ROS General: Denies fever, chills, night sweats, changes in weight, changes in appetite HEENT: Denies headaches, ear pain, changes in vision, rhinorrhea, sore throat CV: Denies CP, palpitations, SOB, orthopnea Pulm: Denies SOB, cough, wheezing GI: Denies abdominal pain, nausea, vomiting, diarrhea, constipation GU: Denies dysuria, hematuria, frequency, vaginal discharge Msk: Denies muscle cramps, joint pains Neuro: Denies weakness, numbness, tingling Skin: Denies rashes, bruising Psych: Denies depression, hallucinations  +anxiety     Objective:    There were no vitals taken for this visit.   Gen. Pleasant, well-nourished, in no distress, normal affect   HEENT: [place]/AT, face symmetric, no scleral icterus, PERRLA,  nares patent without drainage Lungs: no accessory muscle use, CTAB, no wheezes or rales Cardiovascular: RRR, no m/r/g, no peripheral edema Neuro:  A&Ox3, CN II-XII intact, normal gait Skin:  Warm, no lesions/ rash   Wt Readings from Last 3 Encounters:  04/11/17 169 lb 9.6 oz (76.9 kg)    Lab Results  Component Value Date   WBC 4.6 04/11/2017   HGB 13.4 04/11/2017   HCT 41.6 04/11/2017   PLT 366.0 04/11/2017   GLUCOSE 78  04/11/2017   CHOL 239 (H) 04/11/2017   TRIG 113.0 04/11/2017   HDL 74.60 04/11/2017   LDLCALC 142 (H) 04/11/2017   NA 137 04/11/2017   K 4.1 04/11/2017   CL 102 04/11/2017   CREATININE 0.78 04/11/2017   BUN 13 04/11/2017   CO2 25 04/11/2017   TSH 1.20 04/11/2017    Assessment/Plan:  Anxiety -GAD 7 score 6 -Continue Cymbalta 30 mg.  Pt to increase to 60 mg in the next few days. -Pt encouraged to go to counseling.  Tachycardia  -continued,  asymptomatic, in 120s -reviewed labs from last [facility], nml - Plan: Ambulatory referral to Cardiology STAT -Did not complete paperwork for Gyn surgery given continued tachycardia  Pt trying to have hysterectomy before the end of the yr 2/2 insurance.  After evaluation by Cards, if given the ok will drop paper work off by office for completion.

## 2017-04-17 ENCOUNTER — Inpatient Hospital Stay (HOSPITAL_COMMUNITY)
Admission: RE | Admit: 2017-04-17 | Discharge: 2017-04-17 | Disposition: A | Discharge status: Home / Self Care | Payer: 59 | Source: Ambulatory Visit

## 2017-04-30 ENCOUNTER — Encounter (HOSPITAL_COMMUNITY): Admission: AD | Payer: Self-pay | Source: Ambulatory Visit

## 2017-04-30 ENCOUNTER — Inpatient Hospital Stay (HOSPITAL_COMMUNITY): Admission: AD | Admit: 2017-04-30 | Payer: 59 | Source: Ambulatory Visit | Admitting: Obstetrics & Gynecology

## 2017-04-30 SURGERY — HYSTERECTOMY, TOTAL, ABDOMINAL, WITH SALPINGECTOMY
Anesthesia: Choice | Laterality: Bilateral

## 2017-05-08 ENCOUNTER — Telehealth: Payer: Self-pay | Admitting: Emergency Medicine

## 2017-05-08 NOTE — Telephone Encounter (Signed)
Patient would like a refill for Duloxetine 30mg . Would like like for the same dosage to be sent to patient pharmacy with a 90 day supply? Please advise. Thank you.

## 2017-05-09 ENCOUNTER — Encounter: Payer: Self-pay | Admitting: Family Medicine

## 2017-05-09 NOTE — Telephone Encounter (Signed)
Okay to send in 90-day refill on duloxetine 30 mg.

## 2017-05-12 ENCOUNTER — Other Ambulatory Visit: Payer: Self-pay | Admitting: Emergency Medicine

## 2017-05-12 DIAGNOSIS — F419 Anxiety disorder, unspecified: Secondary | ICD-10-CM

## 2017-05-12 MED ORDER — DULOXETINE HCL 30 MG PO CPEP: ORAL_CAPSULE | ORAL | 2 refills | Status: DC

## 2017-05-12 NOTE — Telephone Encounter (Signed)
Refill has been sent to patient pharmacy. Nothing further needed.

## 2017-06-05 ENCOUNTER — Encounter: Payer: Self-pay | Admitting: Cardiology

## 2017-06-05 ENCOUNTER — Ambulatory Visit (INDEPENDENT_AMBULATORY_CARE_PROVIDER_SITE_OTHER): Payer: 59 | Admitting: Cardiology

## 2017-06-05 VITALS — BP 122/84 | HR 131 | Ht 62.0 in | Wt 173.1 lb

## 2017-06-05 DIAGNOSIS — R0602 Shortness of breath: Secondary | ICD-10-CM | POA: Diagnosis not present

## 2017-06-05 DIAGNOSIS — R000 Tachycardia, unspecified: Secondary | ICD-10-CM

## 2017-06-05 LAB — D-DIMER, QUANTITATIVE: D-DIMER: 0.23 mg/L FEU (ref 0.00–0.49)

## 2017-06-05 NOTE — Patient Instructions (Addendum)
Medication Instructions:  The current medical regimen is effective;  continue present plan and medications.  Labwork: Please have blood work today (D-Dimer)  Testing/Procedures: Your physician has requested that you have an echocardiogram. Echocardiography is a painless test that uses sound waves to create images of your heart. It provides your doctor with information about the size and shape of your heart and how well your heart's chambers and valves are working. This procedure takes approximately one hour. There are no restrictions for this procedure.  Your physician has recommended that you wear a holter monitor for 24 hours. Holter monitors are medical devices that record the heart's electrical activity. Doctors most often use these monitors to diagnose arrhythmias. Arrhythmias are problems with the speed or rhythm of the heartbeat. The monitor is a small, portable device. You can wear one while you do your normal daily activities. This is usually used to diagnose what is causing palpitations/syncope (passing out).   Follow-Up: Follow up as needed after the above testing.  If you need a refill on your cardiac medications before your next appointment, please call your pharmacy.  Thank you for choosing [facility]!!

## 2017-06-05 NOTE — Progress Notes (Signed)
Cardiology Office Note:    Date:  06/05/2017   ID:  Melody Sanchez, DOB 06-Sep-1970, MRN [id number]  PCP:  Melody Ruddy, MD  Cardiologist:  No primary care provider on file.   Referring MD: Melody Ruddy, MD     History of Present Illness:    Melody Sanchez is a 47 y.o. female here for evaluation of tachycardia at the request of Dr. Volanda Sanchez.  In review of prior office note, she was found to be tachycardic, taking Cymbalta for anxiety.  She was under increased stress as her father was not doing well.  She was trying to have a hysterectomy done prior to the end of the year.  Heart rate at that time was 120.  She has felt some shortness of breath, her husband Melody Sanchez has noted this.  She works in Therapist, art.  Does have some high anxiety.  She does not remember anyone ever telling her that her heart rate was increased however.  No recent fevers chills nausea vomiting syncope bleeding.  She does have fibroids but her hemoglobin is 13.4.  No recent long travels or history of clotting.  She is not taking any supplements, no stimulants.  Past Medical History:  Diagnosis Date  . Anxiety   . GERD (gastroesophageal reflux disease)   . Migraines     Past Surgical History:  Procedure Laterality Date  . CESAREAN SECTION      Current Medications: Current Meds  Medication Sig  . cyclobenzaprine (FLEXERIL) 10 MG tablet Take 10 mg by mouth 3 (three) times daily as needed for muscle spasms.  . DULoxetine (CYMBALTA) 30 MG capsule Patient is to take two tablets by mouth daily.  Marland Kitchen gabapentin (NEURONTIN) 300 MG capsule Take 300 mg by mouth 3 (three) times daily.  . Multiple Vitamins-Minerals (MULTIVITAMIN WITH MINERALS) tablet Take 1 tablet by mouth daily.  . norethindrone (MICRONOR,CAMILA,ERRIN) 0.35 MG tablet Take 1 tablet by mouth daily.     Allergies:   Patient has no known allergies.   Social History   Socioeconomic History  . Marital status: Married    Spouse name: None    . Number of children: None  . Years of education: None  . Highest education level: None  Social Needs  . Financial resource strain: None  . Food insecurity - worry: None  . Food insecurity - inability: None  . Transportation needs - medical: None  . Transportation needs - non-medical: None  Occupational History  . None  Tobacco Use  . Smoking status: Never Smoker  . Smokeless tobacco: Never Used  Substance and Sexual Activity  . Alcohol use: Yes    Comment: ocassionally   . Drug use: No  . Sexual activity: None  Other Topics Concern  . None  Social History Narrative  . None     Family History: No early CAD  ROS:   Please see the history of present illness.     All other systems reviewed and are negative.  EKGs/Labs/Other Studies Reviewed:    The following studies were reviewed today: Prior office notes, EKG, lab work reviewed  EKG: 04/11/17-sinus tachycardia 110 otherwise unremarkable personally viewed.  EKG is  ordered today.  The ekg ordered today demonstrates sinus tachycardia rate 122 with no other abnormalities personally viewed  Recent Labs: 04/11/2017: BUN 13; Creatinine, Ser 0.78; Hemoglobin 13.4; Platelets 366.0; Potassium 4.1; Sodium 137; TSH 1.20  Recent Lipid Panel    Component Value Date/Time   CHOL 239 (H)  04/11/2017 1000   TRIG 113.0 04/11/2017 1000   HDL 74.60 04/11/2017 1000   CHOLHDL 3 04/11/2017 1000   VLDL 22.6 04/11/2017 1000   LDLCALC 142 (H) 04/11/2017 1000    Physical Exam:    VS:  BP 122/84   Pulse (!) 131   Ht 5\' 2"  (1.575 m)   Wt 173 lb 1.9 oz (78.5 kg)   SpO2 97%   BMI 31.66 kg/m     Wt Readings from Last 3 Encounters:  06/05/17 173 lb 1.9 oz (78.5 kg)  04/11/17 169 lb 9.6 oz (76.9 kg)     GEN:  Well nourished, well developed in no acute distress HEENT: Normal NECK: No JVD; No carotid bruits LYMPHATICS: No lymphadenopathy CARDIAC: tachy reg (mild subtle fluctuation in heart rate upon deep breath and holding it), no  murmurs, rubs, gallops RESPIRATORY:  Clear to auscultation without rales, wheezing or rhonchi  ABDOMEN: Soft, non-tender, non-distended MUSCULOSKELETAL:  No edema; No deformity  SKIN: Warm and dry NEUROLOGIC:  Alert and oriented x 3 PSYCHIATRIC:  Normal affect, mildly anxious  ASSESSMENT:    1. Sinus tachycardia   2. Shortness of breath    PLAN:    In order of problems listed above:  Sinus tachycardia -Some of this could be anxiety driven however I will check an echocardiogram to make sure she has proper structure and function of her heart.  As long as her heart rate is not consistently greater than 125-130 bpm, this should not result in tachycardia mediated cardiomyopathy.  Recommend avoiding caffeine, Sudafed or other stimulant type medications.  Daily exercise will be helpful.  Thyroid function is normal, hemoglobin 13.4, normal, electrolytes are normal.  LDL cholesterol mildly elevated at 142.  Continue with diet and exercise. -I will also check a 24-hour Holter monitor given the fairly incessant nature of her tachycardia.  Went to see what she is doing when she is sleeping. -We will try conservative management, lack of caffeine etc.  Exercise. -Given some associated symptom of shortness of breath, I will check a d-dimer.  If d-dimer is abnormal, we will refer her for a CT angiogram of chest.  I will follow-up with studies.  If they are unremarkable, she may proceed with hysterectomy with low overall cardiac risk.   Medication Adjustments/Labs and Tests Ordered: Current medicines are reviewed at length with the patient today.  Concerns regarding medicines are outlined above.  Orders Placed This Encounter  Procedures  . D-Dimer, Quantitative  . Holter monitor - 24 hour  . EKG 12-Lead  . ECHOCARDIOGRAM COMPLETE   No orders of the defined types were placed in this encounter.   Signed, Melody Furbish, MD  06/05/2017 2:24 PM    [place]

## 2017-06-17 ENCOUNTER — Ambulatory Visit (INDEPENDENT_AMBULATORY_CARE_PROVIDER_SITE_OTHER): Payer: 59

## 2017-06-17 ENCOUNTER — Ambulatory Visit (HOSPITAL_COMMUNITY): Payer: 59 | Attending: Cardiology

## 2017-06-17 ENCOUNTER — Other Ambulatory Visit: Payer: Self-pay

## 2017-06-17 DIAGNOSIS — I517 Cardiomegaly: Secondary | ICD-10-CM | POA: Insufficient documentation

## 2017-06-17 DIAGNOSIS — R0600 Dyspnea, unspecified: Secondary | ICD-10-CM | POA: Insufficient documentation

## 2017-06-17 DIAGNOSIS — R000 Tachycardia, unspecified: Secondary | ICD-10-CM

## 2017-06-17 DIAGNOSIS — R0602 Shortness of breath: Secondary | ICD-10-CM | POA: Diagnosis not present

## 2017-07-01 ENCOUNTER — Encounter: Payer: Self-pay | Admitting: *Deleted

## 2017-08-12 ENCOUNTER — Other Ambulatory Visit: Payer: Self-pay | Admitting: Rehabilitation

## 2017-08-12 DIAGNOSIS — M542 Cervicalgia: Secondary | ICD-10-CM

## 2017-08-30 ENCOUNTER — Ambulatory Visit
Admission: RE | Admit: 2017-08-30 | Discharge: 2017-08-30 | Disposition: A | Discharge status: Home / Self Care | Payer: 59 | Source: Ambulatory Visit | Attending: Rehabilitation | Admitting: Rehabilitation

## 2017-08-30 DIAGNOSIS — M542 Cervicalgia: Secondary | ICD-10-CM

## 2017-09-01 ENCOUNTER — Other Ambulatory Visit: Payer: Self-pay | Admitting: Family Medicine

## 2017-09-01 DIAGNOSIS — F419 Anxiety disorder, unspecified: Secondary | ICD-10-CM

## 2017-09-01 NOTE — Telephone Encounter (Signed)
Ok to refill medication     Please advise

## 2017-10-04 HISTORY — PX: LAMINECTOMY: SHX219

## 2017-10-29 DIAGNOSIS — M4726 Other spondylosis with radiculopathy, lumbar region: Secondary | ICD-10-CM | POA: Insufficient documentation

## 2017-11-29 ENCOUNTER — Other Ambulatory Visit: Payer: Self-pay | Admitting: Family Medicine

## 2017-11-29 DIAGNOSIS — F419 Anxiety disorder, unspecified: Secondary | ICD-10-CM

## 2018-01-01 ENCOUNTER — Ambulatory Visit (INDEPENDENT_AMBULATORY_CARE_PROVIDER_SITE_OTHER): Payer: 59 | Admitting: Family Medicine

## 2018-01-01 ENCOUNTER — Encounter: Payer: Self-pay | Admitting: Family Medicine

## 2018-01-01 VITALS — BP 120/70 | HR 90 | Temp 98.4°F | Ht 62.0 in | Wt 173.8 lb

## 2018-01-01 DIAGNOSIS — J029 Acute pharyngitis, unspecified: Secondary | ICD-10-CM

## 2018-01-01 LAB — POCT RAPID STREP A (OFFICE): Rapid Strep A Screen: NEGATIVE

## 2018-01-01 NOTE — Addendum Note (Signed)
Addended by: Agnes Lawrence on: 01/01/2018 04:12 PM   Modules accepted: Orders

## 2018-01-01 NOTE — Patient Instructions (Signed)
BEFORE YOU LEAVE: -Rapid strep test -follow up: As needed   INSTRUCTIONS FOR UPPER RESPIRATORY INFECTION:  -plenty of rest and fluids  -nasal saline wash 2-3 times daily (use prepackaged nasal saline or bottled/distilled water if making your own)   -can use AFRIN nasal spray for drainage and nasal congestion - but do NOT use longer then 3-4 days  -can use tylenol (in no history of liver disease) or ibuprofen (if no history of kidney disease, bowel bleeding or significant heart disease) as directed for aches and sorethroat  -in the winter time, using a humidifier at night is helpful (please follow cleaning instructions)  -if you are taking a cough medication - use only as directed, may also try a teaspoon of honey to coat the throat and throat lozenges.   -for sore throat, salt water gargles can help  -follow up if you have fevers, facial pain, tooth pain, difficulty breathing or are worsening or symptoms persist longer then expected  Upper Respiratory Infection, Adult An upper respiratory infection (URI) is also known as the common cold. It is often caused by a type of germ (virus). Colds are easily spread (contagious). You can pass it to others by kissing, coughing, sneezing, or drinking out of the same glass. Usually, you get better in 1 to 3  weeks.  However, the cough can last for even longer. HOME CARE   Only take medicine as told by your doctor. Follow instructions provided above.  Drink enough water and fluids to keep your pee (urine) clear or pale yellow.  Get plenty of rest.  Return to work when your temperature is < 100 for 24 hours or as told by your doctor. You may use a face mask and wash your hands to stop your cold from spreading. GET HELP RIGHT AWAY IF:   After the first few days, you feel you are getting worse.  You have questions about your medicine.  You have chills, shortness of breath, or red spit (mucus).  You have pain in the face for more then 1-2  days, especially when you bend forward.  You have a fever, puffy (swollen) neck, pain when you swallow, or white spots in the back of your throat.  You have a bad headache, ear pain, sinus pain, or chest pain.  You have a high-pitched whistling sound when you breathe in and out (wheezing).  You cough up blood.  You have sore muscles or a stiff neck. MAKE SURE YOU:   Understand these instructions.  Will watch your condition.  Will get help right away if you are not doing well or get worse. Document Released: 10/09/2007 Document Revised: 07/15/2011 Document Reviewed: 07/28/2013 [facility] Patient Information 2015 [place], [place]. This information is not intended to replace advice given to you by your health care provider. Make sure you discuss any questions you have with your health care provider.

## 2018-01-01 NOTE — Progress Notes (Signed)
HPI:  Using dictation device. Unfortunately this device frequently misinterprets words/phrases.   Acute visit for respiratory illness: -started: 2 days ago -symptoms:nasal congestion, sore throat, cough, Ha -denies:fever, SOB, NVD, tooth pain, body aches -has tried: OTC cold medications -sick contacts/travel/risks: no reported flu, strep or tick exposure - daughter with similar symptoms -Hx of: no hx lung disease or immunocompromised condition  ROS: See pertinent positives and negatives per HPI.  Past Medical History:  Diagnosis Date  . Anxiety   . GERD (gastroesophageal reflux disease)   . Migraines     Past Surgical History:  Procedure Laterality Date  . CESAREAN SECTION      History reviewed. No pertinent family history.  Social History   Socioeconomic History  . Marital status: Married    Spouse name: Not on file  . Number of children: Not on file  . Years of education: Not on file  . Highest education level: Not on file  Occupational History  . Not on file  Social Needs  . Financial resource strain: Not on file  . Food insecurity:    Worry: Not on file    Inability: Not on file  . Transportation needs:    Medical: Not on file    Non-medical: Not on file  Tobacco Use  . Smoking status: Never Smoker  . Smokeless tobacco: Never Used  Substance and Sexual Activity  . Alcohol use: Yes    Comment: ocassionally   . Drug use: No  . Sexual activity: Not on file  Lifestyle  . Physical activity:    Days per week: Not on file    Minutes per session: Not on file  . Stress: Not on file  Relationships  . Social connections:    Talks on phone: Not on file    Gets together: Not on file    Attends religious service: Not on file    Active member of club or organization: Not on file    Attends meetings of clubs or organizations: Not on file    Relationship status: Not on file  Other Topics Concern  . Not on file  Social History Narrative  . Not on file      Current Outpatient Medications:  .  cyclobenzaprine (FLEXERIL) 10 MG tablet, Take 10 mg by mouth 3 (three) times daily as needed for muscle spasms., Disp: , Rfl:  .  DULoxetine (CYMBALTA) 30 MG capsule, TAKE 2 CAPSULES EVERY DAY, Disp: 180 capsule, Rfl: 0 .  gabapentin (NEURONTIN) 300 MG capsule, Take 300 mg by mouth 3 (three) times daily., Disp: , Rfl:  .  Multiple Vitamins-Minerals (MULTIVITAMIN WITH MINERALS) tablet, Take 1 tablet by mouth daily., Disp: , Rfl:  .  norethindrone (MICRONOR,CAMILA,ERRIN) 0.35 MG tablet, Take 1 tablet by mouth daily., Disp: , Rfl: 6  EXAM:  Vitals:   01/01/18 1535  BP: 120/70  Pulse: 90  Temp: 98.4 F (36.9 C)  SpO2: 99%    Body mass index is 31.79 kg/m.  GENERAL: vitals reviewed and listed above, alert, oriented, appears well hydrated and in no acute distress  HEENT: atraumatic, conjunttiva clear, no obvious abnormalities on inspection of external nose and ears, normal appearance of ear canals and TMs, clear nasal congestion, mild post oropharyngeal erythema with PND, no tonsillar edema or exudate, no sinus TTP  NECK: no obvious masses on inspection  LUNGS: clear to auscultation bilaterally, no wheezes, rales or rhonchi, good air movement  CV: HRRR, no peripheral edema  MS: moves all extremities  without noticeable abnormality  PSYCH: pleasant and cooperative, no obvious depression or anxiety  ASSESSMENT AND PLAN:  Discussed the following assessment and plan:  Sore throat  -given HPI and exam findings today, a serious infection or illness is unlikely. We discussed potential etiologies, with VURI being most likely, and advised supportive care and monitoring. We discussed treatment side effects, likely course, antibiotic misuse, transmission, and signs of developing a serious illness. Rapid strep test advised, though suspect viral URI. -of course, we advised to return or notify a doctor immediately if symptoms worsen or persist or new  concerns arise.    Patient Instructions  BEFORE YOU LEAVE: -Rapid strep test -follow up: As needed   INSTRUCTIONS FOR UPPER RESPIRATORY INFECTION:  -plenty of rest and fluids  -nasal saline wash 2-3 times daily (use prepackaged nasal saline or bottled/distilled water if making your own)   -can use AFRIN nasal spray for drainage and nasal congestion - but do NOT use longer then 3-4 days  -can use tylenol (in no history of liver disease) or ibuprofen (if no history of kidney disease, bowel bleeding or significant heart disease) as directed for aches and sorethroat  -in the winter time, using a humidifier at night is helpful (please follow cleaning instructions)  -if you are taking a cough medication - use only as directed, may also try a teaspoon of honey to coat the throat and throat lozenges.   -for sore throat, salt water gargles can help  -follow up if you have fevers, facial pain, tooth pain, difficulty breathing or are worsening or symptoms persist longer then expected  Upper Respiratory Infection, Adult An upper respiratory infection (URI) is also known as the common cold. It is often caused by a type of germ (virus). Colds are easily spread (contagious). You can pass it to others by kissing, coughing, sneezing, or drinking out of the same glass. Usually, you get better in 1 to 3  weeks.  However, the cough can last for even longer. HOME CARE   Only take medicine as told by your doctor. Follow instructions provided above.  Drink enough water and fluids to keep your pee (urine) clear or pale yellow.  Get plenty of rest.  Return to work when your temperature is < 100 for 24 hours or as told by your doctor. You may use a face mask and wash your hands to stop your cold from spreading. GET HELP RIGHT AWAY IF:   After the first few days, you feel you are getting worse.  You have questions about your medicine.  You have chills, shortness of breath, or red spit  (mucus).  You have pain in the face for more then 1-2 days, especially when you bend forward.  You have a fever, puffy (swollen) neck, pain when you swallow, or white spots in the back of your throat.  You have a bad headache, ear pain, sinus pain, or chest pain.  You have a high-pitched whistling sound when you breathe in and out (wheezing).  You cough up blood.  You have sore muscles or a stiff neck. MAKE SURE YOU:   Understand these instructions.  Will watch your condition.  Will get help right away if you are not doing well or get worse. Document Released: 10/09/2007 Document Revised: 07/15/2011 Document Reviewed: 07/28/2013 [facility] Patient Information 2015 [place], [place]. This information is not intended to replace advice given to you by your health care provider. Make sure you discuss any questions you have with your health  care provider.     Lucretia Kern, DO

## 2018-01-16 ENCOUNTER — Other Ambulatory Visit: Payer: Self-pay | Admitting: Obstetrics & Gynecology

## 2018-01-16 DIAGNOSIS — Z1231 Encounter for screening mammogram for malignant neoplasm of breast: Secondary | ICD-10-CM

## 2018-02-13 ENCOUNTER — Ambulatory Visit
Admission: RE | Admit: 2018-02-13 | Discharge: 2018-02-13 | Disposition: A | Discharge status: Home / Self Care | Payer: 59 | Source: Ambulatory Visit | Attending: Obstetrics & Gynecology | Admitting: Obstetrics & Gynecology

## 2018-02-13 DIAGNOSIS — Z1231 Encounter for screening mammogram for malignant neoplasm of breast: Secondary | ICD-10-CM

## 2018-02-23 ENCOUNTER — Other Ambulatory Visit: Payer: Self-pay | Admitting: Family Medicine

## 2018-02-23 DIAGNOSIS — F419 Anxiety disorder, unspecified: Secondary | ICD-10-CM

## 2018-06-18 ENCOUNTER — Other Ambulatory Visit: Payer: Self-pay | Admitting: Family Medicine

## 2018-06-18 DIAGNOSIS — F419 Anxiety disorder, unspecified: Secondary | ICD-10-CM

## 2018-09-13 ENCOUNTER — Other Ambulatory Visit: Payer: Self-pay | Admitting: Family Medicine

## 2018-09-13 DIAGNOSIS — F419 Anxiety disorder, unspecified: Secondary | ICD-10-CM

## 2018-09-14 ENCOUNTER — Encounter: Payer: Self-pay | Admitting: Family Medicine

## 2018-09-14 ENCOUNTER — Ambulatory Visit (INDEPENDENT_AMBULATORY_CARE_PROVIDER_SITE_OTHER): Payer: 59 | Admitting: Family Medicine

## 2018-09-14 ENCOUNTER — Other Ambulatory Visit: Payer: Self-pay

## 2018-09-14 DIAGNOSIS — F419 Anxiety disorder, unspecified: Secondary | ICD-10-CM

## 2018-09-14 MED ORDER — DULOXETINE HCL 30 MG PO CPEP: 60.0000 mg | ORAL_CAPSULE | Freq: Every day | ORAL | 2 refills | Status: DC

## 2018-09-14 NOTE — Progress Notes (Signed)
Virtual Visit via Telephone Note  I connected with Melody Sanchez on 09/14/18 at  1:30 PM EDT by telephone and verified that I am speaking with the correct person using two identifiers.   I discussed the limitations, risks, security and privacy concerns of performing an evaluation and management service by telephone and the availability of in person appointments. I also discussed with the patient that there may be a patient responsible charge related to this service. The patient expressed understanding and agreed to proceed.  Location patient: home Location provider: work or home office Participants present for the call: patient, provider Patient did not have a visit in the prior 7 days to address this/these issue(s).   History of Present Illness: Pt notes still with some anxiety.   Tries to do deep breathing when anxiety starts.  Feeling better on cymbalta.  Sleep is better.  Still feeling tired in am.  Feels like it is due to not being able to exercise like she did in the past.  Has been walking.  Back/hip pain keeping her from exercising.  [facility]  S/p surgery hemilaminectomy and discectomy in 10/2017.  May have to get another injection.  Pt notes trying to figure out what to do for her daughter [name], who is a Dentist.   Observations/Objective: Patient sounds cheerful and well on the phone. I do not appreciate any SOB. Speech and thought processing are grossly intact. Patient reported vitals:  RR between 12-20 bpm  Assessment and Plan: Anxiety  -continue deep breathing and other ways to reduce anxiety -consider counseling. - Plan: DULoxetine (CYMBALTA) 30 MG capsule   Follow Up Instructions: F/u prn in the next 3 months  I did not refer this patient for an OV in the next 24 hours for this/these issue(s).  I discussed the assessment and treatment plan with the patient. The patient was provided an opportunity to ask questions and all  were answered. The patient agreed with the plan and demonstrated an understanding of the instructions.   The patient was advised to call back or seek an in-person evaluation if the symptoms worsen or if the condition fails to improve as anticipated.  I provided 13 minutes of non-face-to-face time during this encounter.   Billie Ruddy, MD

## 2018-09-14 NOTE — Telephone Encounter (Signed)
Pt has a virtual visit with dr Volanda Napoleon this afternoon for her refill on cymbalta

## 2018-11-27 IMAGING — MG DIGITAL SCREENING BILATERAL MAMMOGRAM WITH TOMO AND CAD
8 series · 8 of 24 positions shown · non-contrast
Comparison: Previous exam(s).

CLINICAL DATA: Screening.

EXAM:
DIGITAL SCREENING BILATERAL MAMMOGRAM WITH TOMO AND CAD

[L MLO synth-2D]
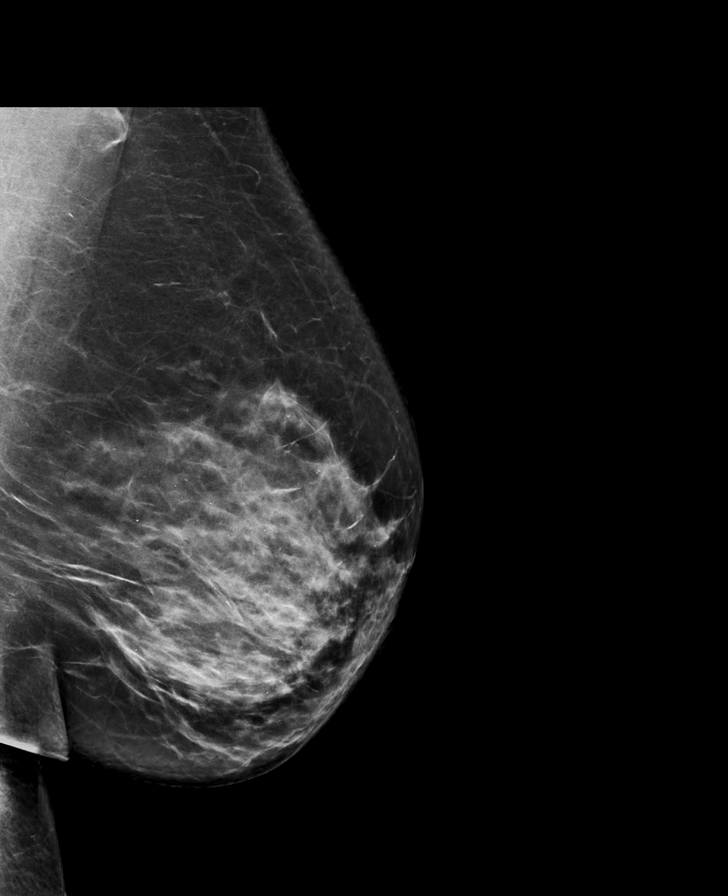

[R CC synth-2D]
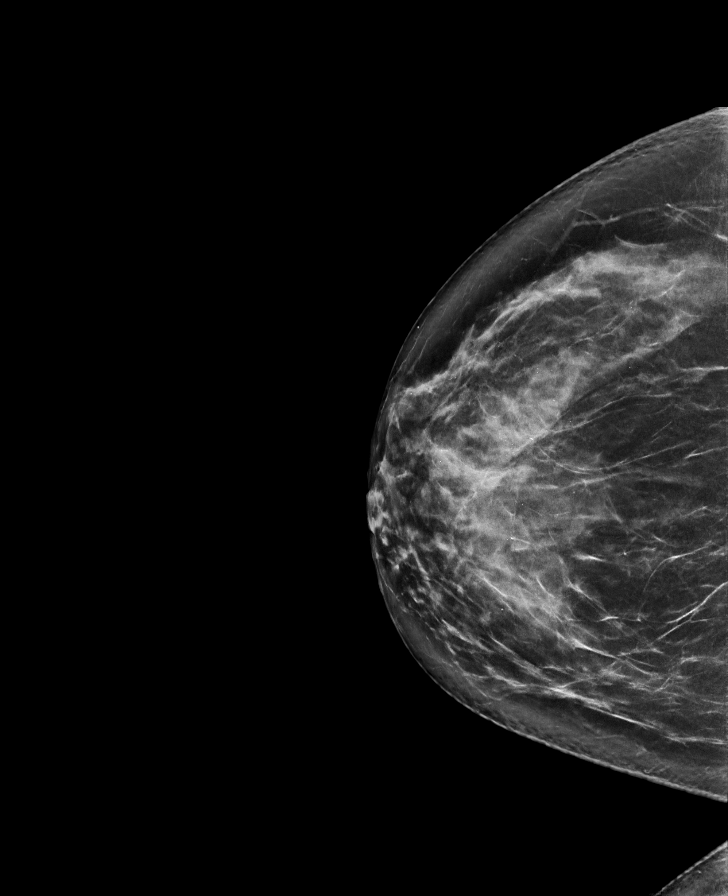

[R MLO synth-2D]
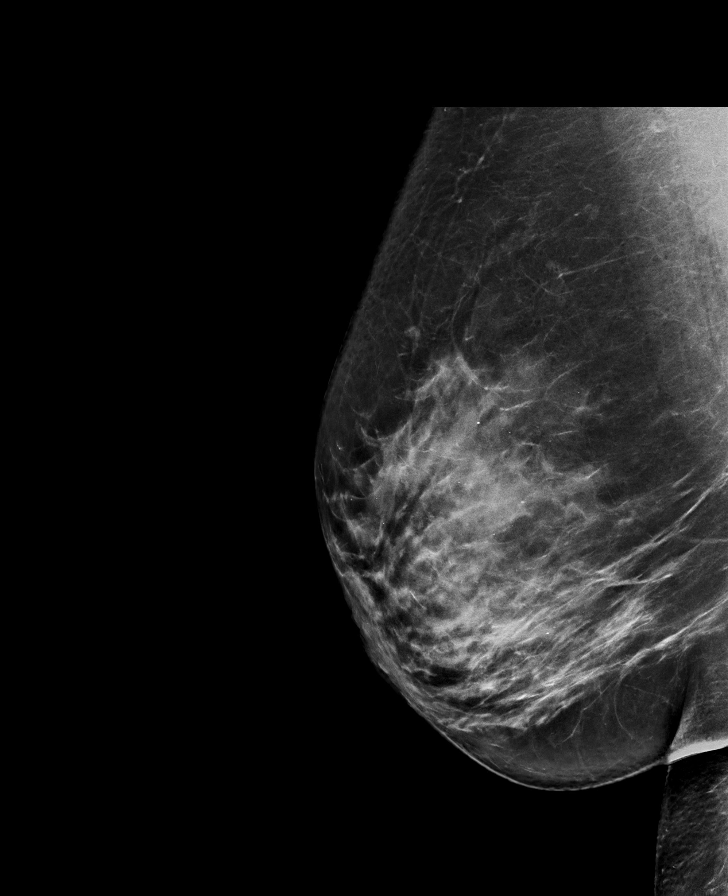

[L CC synth-2D]
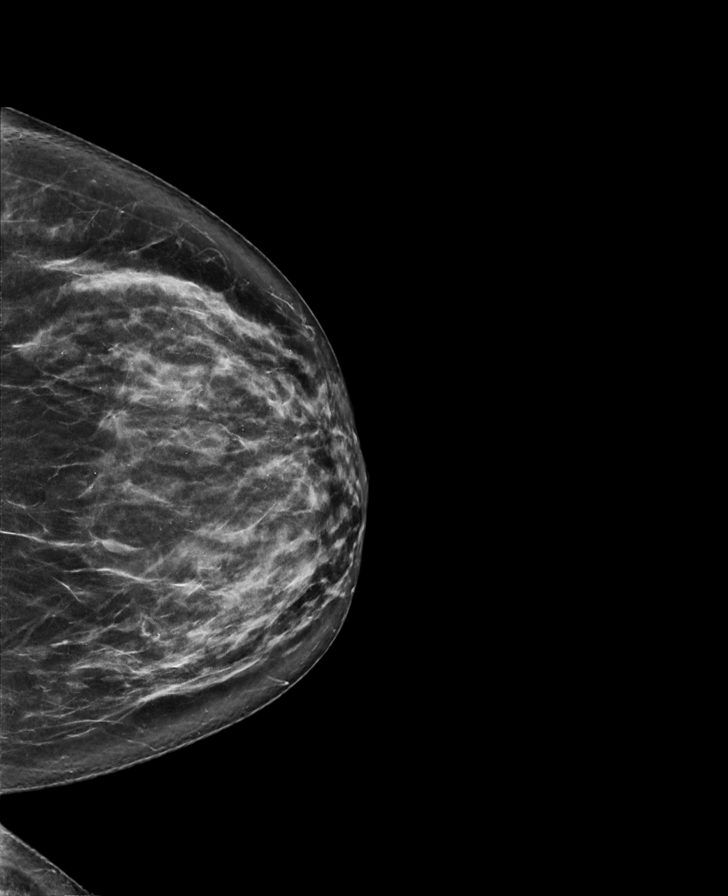

[R CC tomo · tomo slice 45/90.0]
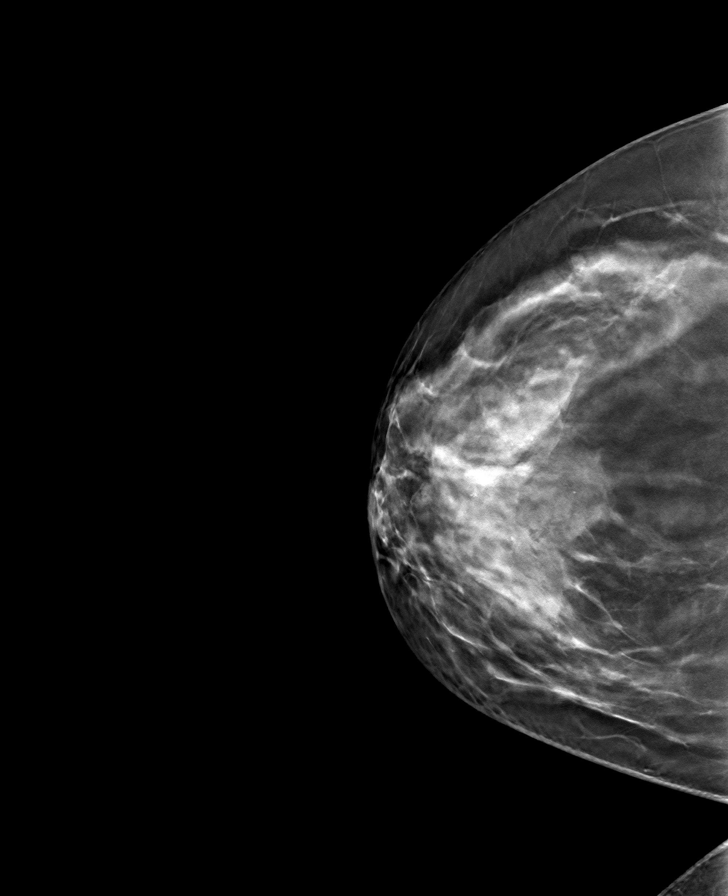

[L CC tomo · tomo slice 42/83.0]
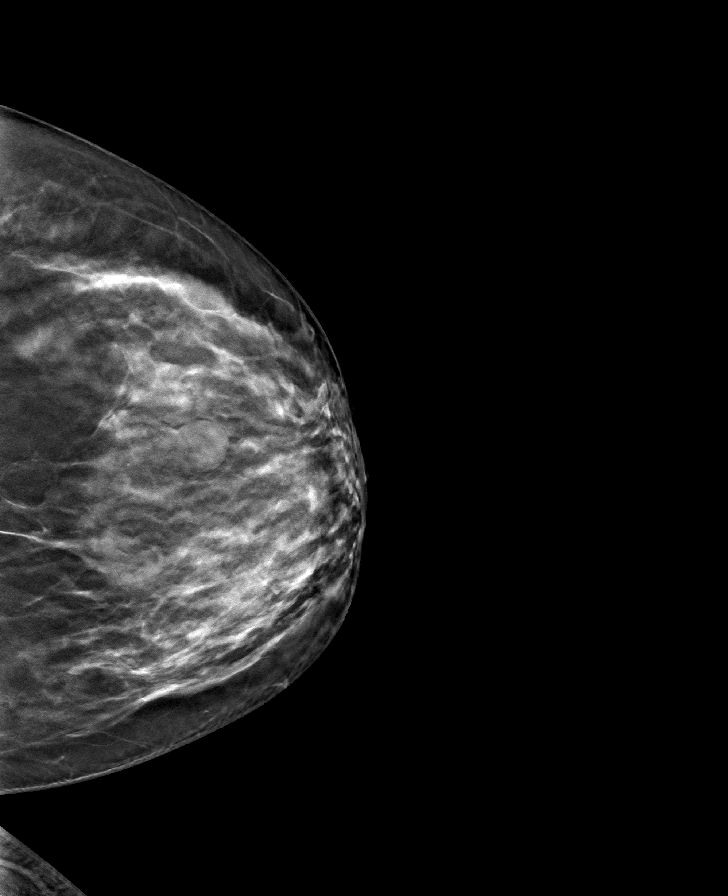

[R MLO tomo · tomo slice 48/95.0]
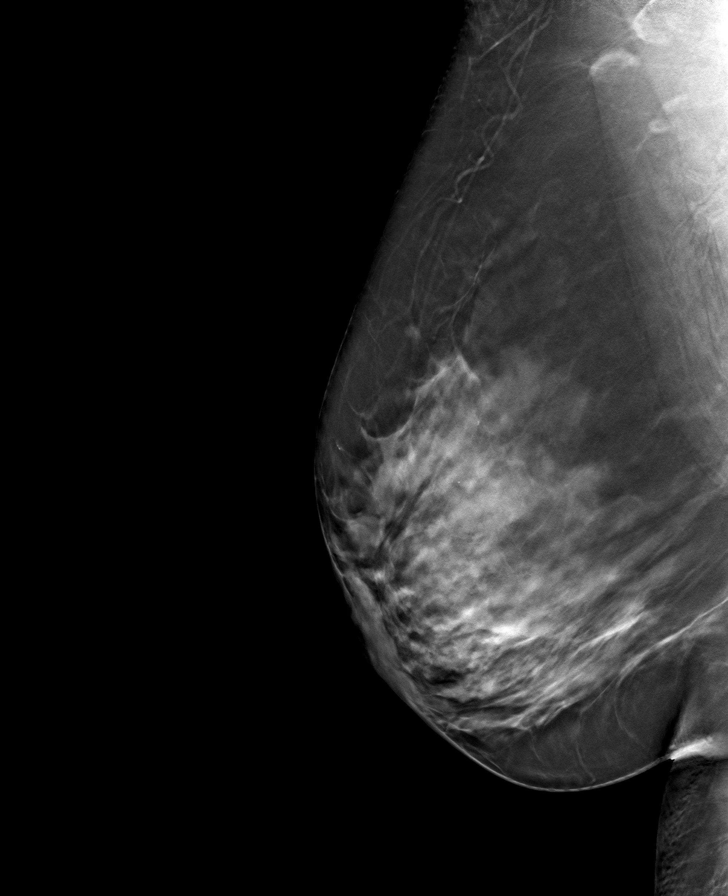

[L MLO tomo · tomo slice 49/97.0]
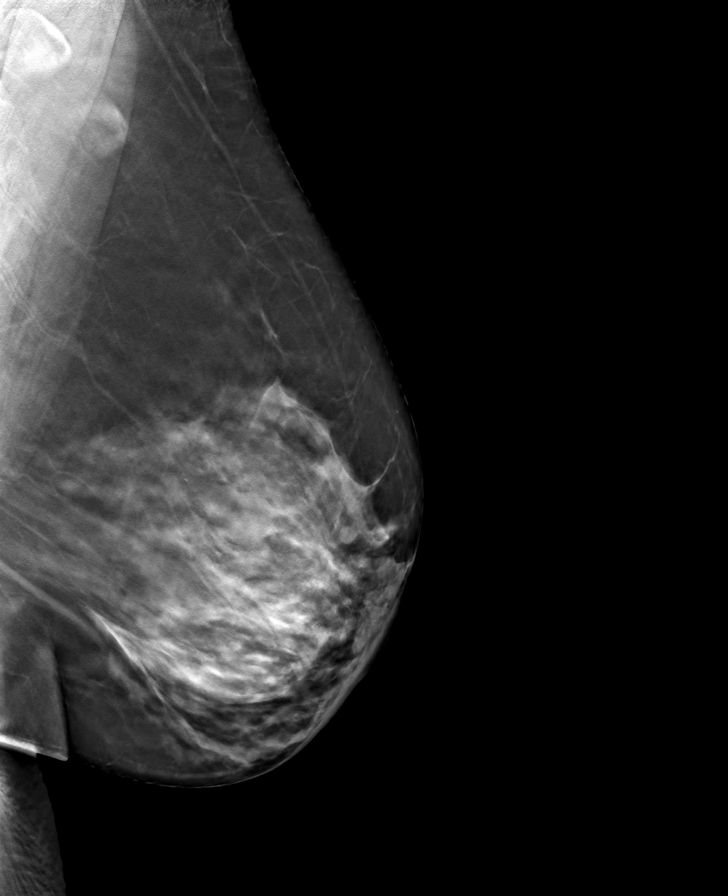

[8 of 24 positions shown; findings below may reference images not displayed]

ACR Breast Density Category c: The breast tissue is heterogeneously
dense, which may obscure small masses.
FINDINGS: There are no findings suspicious for malignancy. Images were
processed with CAD.
IMPRESSION: No mammographic evidence of malignancy. A result letter of this
screening mammogram will be mailed directly to the patient.

RECOMMENDATION:
Screening mammogram in one year. (Code:FT-U-LHB)

BI-RADS CATEGORY  1: Negative.

## 2018-12-03 ENCOUNTER — Other Ambulatory Visit: Payer: Self-pay | Admitting: Obstetrics & Gynecology

## 2018-12-03 DIAGNOSIS — Z1231 Encounter for screening mammogram for malignant neoplasm of breast: Secondary | ICD-10-CM

## 2019-02-04 ENCOUNTER — Other Ambulatory Visit: Payer: Self-pay | Admitting: Obstetrics and Gynecology

## 2019-02-09 ENCOUNTER — Encounter (HOSPITAL_COMMUNITY): Payer: Self-pay

## 2019-02-10 ENCOUNTER — Other Ambulatory Visit: Payer: Self-pay

## 2019-02-10 ENCOUNTER — Encounter (HOSPITAL_COMMUNITY)
Admission: RE | Admit: 2019-02-10 | Discharge: 2019-02-10 | Disposition: A | Discharge status: Home / Self Care | Payer: 59 | Source: Ambulatory Visit | Attending: Obstetrics and Gynecology | Admitting: Obstetrics and Gynecology

## 2019-02-10 ENCOUNTER — Other Ambulatory Visit: Payer: Self-pay | Admitting: Obstetrics and Gynecology

## 2019-02-10 ENCOUNTER — Encounter (HOSPITAL_COMMUNITY): Payer: Self-pay

## 2019-02-10 DIAGNOSIS — D259 Leiomyoma of uterus, unspecified: Secondary | ICD-10-CM | POA: Insufficient documentation

## 2019-02-10 DIAGNOSIS — Z01812 Encounter for preprocedural laboratory examination: Secondary | ICD-10-CM | POA: Diagnosis present

## 2019-02-10 HISTORY — DX: Leiomyoma of uterus, unspecified: D25.9

## 2019-02-10 HISTORY — DX: Tachycardia, unspecified: R00.0

## 2019-02-10 HISTORY — DX: Iron deficiency anemia, unspecified: D50.9

## 2019-02-10 HISTORY — DX: Unspecified osteoarthritis, unspecified site: M19.90

## 2019-02-10 LAB — CBC
HCT: 35.6 % — ABNORMAL LOW (ref 36.0–46.0)
Hemoglobin: 10.8 g/dL — ABNORMAL LOW (ref 12.0–15.0)
MCH: 25.5 pg — ABNORMAL LOW (ref 26.0–34.0)
MCHC: 30.3 g/dL (ref 30.0–36.0)
MCV: 84.2 fL (ref 80.0–100.0)
Platelets: 447 10*3/uL — ABNORMAL HIGH (ref 150–400)
RBC: 4.23 MIL/uL (ref 3.87–5.11)
RDW: 16 % — ABNORMAL HIGH (ref 11.5–15.5)
WBC: 4.5 10*3/uL (ref 4.0–10.5)
nRBC: 0 % (ref 0.0–0.2)

## 2019-02-10 LAB — ABO/RH: ABO/RH(D): O POS

## 2019-02-10 NOTE — Patient Instructions (Addendum)
DUE TO COVID-19 ONLY ONE VISITOR IS ALLOWED IN WAITING ROOM (VISITOR WILL HAVE A TEMPERATURE CHECK ON ARRIVAL AND MUST WEAR A FACE MASK THE ENTIRE TIME.)  ONCE YOU ARE ADMITTED TO YOUR PRIVATE ROOM, THE SAME ONE VISITOR IS ALLOWED TO VISIT DURING VISITING HOURS ONLY.  Your COVID swab testing is scheduled for Saturday, Oct. 10, 2020 at 9:30AM  at  , You must self quarantine after your testing per handout given to you at the testing site.  ([facility] testing enter pre-surgical testing line)    Your procedure is scheduled on:  Wednesday, Oct. 14, 2020  Report to [facility] AT  6:30 A. M.   Call this number if you have problems the morning of surgery:  [phone].   OUR ADDRESS IS [address].  WE ARE LOCATED IN THE [place]                                   [facility].                                     REMEMBER:  DO NOT EAT FOOD OR DRINK LIQUIDS AFTER MIDNIGHT .    BRUSH YOUR TEETH THE MORNING OF SURGERY.  TAKE THESE MEDICATIONS MORNING OF SURGERY WITH A SIP OF WATER:  Duloxetine, Gabapentin, Trintellix  May use eye drops day of surgery if needed  DO NOT WEAR JEWERLY, MAKE UP, OR NAIL POLISH.  DO NOT WEAR LOTIONS, POWDERS, PERFUMES/COLOGNE OR DEODORANT.  DO NOT SHAVE FOR 24 HOURS PRIOR TO DAY OF SURGERY.  MEN MAY SHAVE FACE AND NECK.  CONTACTS, GLASSES, OR DENTURES MAY NOT BE WORN TO SURGERY.                                    [facility] IS NOT RESPONSIBLE  FOR ANY BELONGINGS.       Bring Prescription medication day of surgery in original containers                                                                .[facility] - Preparing for Surgery Before surgery, you can play an important role.  Because skin is not sterile, your skin needs to be as free of germs as possible.  You can reduce the number of germs on your skin by washing with CHG (chlorahexidine gluconate) soap before surgery.   CHG is an antiseptic cleaner which kills germs and bonds with the skin to continue killing germs even after washing. Please DO NOT use if you have an allergy to CHG or antibacterial soaps.  If your skin becomes reddened/irritated stop using the CHG and inform your nurse when you arrive at Short Stay. Do not shave (including legs and underarms) for at least 48 hours prior to the first CHG shower.  You may shave your face/neck.  Please follow these instructions carefully:  1.  Shower with CHG Soap the night before surgery and the  morning of  surgery.  2.  If you choose to wash your hair, wash your hair first as usual with your normal  shampoo.  3.  After you shampoo, rinse your hair and body thoroughly to remove the shampoo.                             4.  Use CHG as you would any other liquid soap.  You can apply chg directly to the skin and wash.  Gently with a scrungie or clean washcloth.  5.  Apply the CHG Soap to your body ONLY FROM THE NECK DOWN.   Do   not use on face/ open                           Wound or open sores. Avoid contact with eyes, ears mouth and   genitals (private parts).                       Wash face,  Genitals (private parts) with your normal soap.             6.  Wash thoroughly, paying special attention to the area where your    surgery  will be performed.  7.  Thoroughly rinse your body with warm water from the neck down.  8.  DO NOT shower/wash with your normal soap after using and rinsing off the CHG Soap.                9.  Pat yourself dry with a clean towel.            10.  Wear clean pajamas.            11.  Place clean sheets on your bed the night of your first shower and do not  sleep with pets. Day of Surgery : Do not apply any lotions/deodorants the morning of surgery.  Please wear clean clothes to the hospital/surgery center.  FAILURE TO FOLLOW THESE INSTRUCTIONS MAY RESULT IN THE CANCELLATION OF YOUR SURGERY  PATIENT  SIGNATURE_________________________________  NURSE SIGNATURE__________________________________  ________________________________________________________________________  WHAT IS A BLOOD TRANSFUSION? Blood Transfusion Information  A transfusion is the replacement of blood or some of its parts. Blood is made up of multiple cells which provide different functions.  Red blood cells carry oxygen and are used for blood loss replacement.  White blood cells fight against infection.  Platelets control bleeding.  Plasma helps clot blood.  Other blood products are available for specialized needs, such as hemophilia or other clotting disorders. BEFORE THE TRANSFUSION  Who gives blood for transfusions?   Healthy volunteers who are fully evaluated to make sure their blood is safe. This is blood bank blood. Transfusion therapy is the safest it has ever been in the practice of medicine. Before blood is taken from a donor, a complete history is taken to make sure that person has no history of diseases nor engages in risky social behavior (examples are intravenous drug use or sexual activity with multiple partners). The donor's travel history is screened to minimize risk of transmitting infections, such as malaria. The donated blood is tested for signs of infectious diseases, such as HIV and hepatitis. The blood is then tested to be sure it is compatible with you in order to minimize the chance of a transfusion reaction. If you or a relative donates blood, this is often done  in anticipation of surgery and is not appropriate for emergency situations. It takes many days to process the donated blood. RISKS AND COMPLICATIONS Although transfusion therapy is very safe and saves many lives, the main dangers of transfusion include:   Getting an infectious disease.  Developing a transfusion reaction. This is an allergic reaction to something in the blood you were given. Every precaution is taken to prevent  this. The decision to have a blood transfusion has been considered carefully by your caregiver before blood is given. Blood is not given unless the benefits outweigh the risks. AFTER THE TRANSFUSION  Right after receiving a blood transfusion, you will usually feel much better and more energetic. This is especially true if your red blood cells have gotten low (anemic). The transfusion raises the level of the red blood cells which carry oxygen, and this usually causes an energy increase.  The nurse administering the transfusion will monitor you carefully for complications. HOME CARE INSTRUCTIONS  No special instructions are needed after a transfusion. You may find your energy is better. Speak with your caregiver about any limitations on activity for underlying diseases you may have. SEEK MEDICAL CARE IF:   Your condition is not improving after your transfusion.  You develop redness or irritation at the intravenous (IV) site. SEEK IMMEDIATE MEDICAL CARE IF:  Any of the following symptoms occur over the next 12 hours:  Shaking chills.  You have a temperature by mouth above 102 F (38.9 C), not controlled by medicine.  Chest, back, or muscle pain.  People around you feel you are not acting correctly or are confused.  Shortness of breath or difficulty breathing.  Dizziness and fainting.  You get a rash or develop hives.  You have a decrease in urine output.  Your urine turns a dark color or changes to pink, red, or brown. Any of the following symptoms occur over the next 10 days:  You have a temperature by mouth above 102 F (38.9 C), not controlled by medicine.  Shortness of breath.  Weakness after normal activity.  The white part of the eye turns yellow (jaundice).  You have a decrease in the amount of urine or are urinating less often.  Your urine turns a dark color or changes to pink, red, or brown. Document Released: 04/19/2000 Document Revised: 07/15/2011 Document  Reviewed: 12/07/2007 [facility] Patient Information 2014 [place], [place].  _______________________________________________________________________

## 2019-02-10 NOTE — Progress Notes (Signed)
SPOKE W/  [facility]     SCREENING SYMPTOMS OF COVID 19:   COUGH--NO  RUNNY NOSE--- NO  SORE THROAT---NO  NASAL CONGESTION----NO  SNEEZING----NO  SHORTNESS OF BREATH---NO  DIFFICULTY BREATHING---NO  TEMP >100.0 -----NO  UNEXPLAINED BODY ACHES------NO  CHILLS -------- NO  HEADACHES ---------NO  LOSS OF SMELL/ TASTE --------NO    HAVE YOU OR ANY FAMILY MEMBER TRAVELLED PAST 14 DAYS OUT OF THE   COUNTY-NO STATE----NO COUNTRY----NO  HAVE YOU OR ANY FAMILY MEMBER BEEN EXPOSED TO ANYONE WITH COVID 19? NO

## 2019-02-10 NOTE — H&P (Deleted)
  The note originally documented on this encounter has been moved the the encounter in which it belongs.  
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
## 2019-02-10 NOTE — Progress Notes (Signed)
PCP - Dr. Lockie Pares Last office visit 09/14/2018 in epic Cardiologist - Dr. Derl Barrow last office visit 06/05/2017 in epic  Chest x-ray - N/A EKG - greater than 1 year Stress Test - N/A ECHO - 06/17/2017 in epic Cardiac Cath - N/A  Sleep Study - N/A CPAP - N/A  Fasting Blood Sugar - N/A Checks Blood Sugar _N/A____ times a day  Blood Thinner Instructions:  N/A Aspirin Instructions:  N/A Last Dose:  N/A  Anesthesia review: N/A  Patient denies shortness of breath, fever, cough and chest pain at PAT appointment   Patient verbalized understanding of instructions that were given to them at the PAT appointment. Patient was also instructed that they will need to review over the PAT instructions again at home before surgery.

## 2019-02-13 ENCOUNTER — Other Ambulatory Visit (HOSPITAL_COMMUNITY)
Admission: RE | Admit: 2019-02-13 | Discharge: 2019-02-13 | Disposition: A | Discharge status: Home / Self Care | Payer: 59 | Source: Ambulatory Visit | Attending: Obstetrics and Gynecology | Admitting: Obstetrics and Gynecology

## 2019-02-13 DIAGNOSIS — Z01812 Encounter for preprocedural laboratory examination: Secondary | ICD-10-CM | POA: Diagnosis not present

## 2019-02-13 DIAGNOSIS — Z20828 Contact with and (suspected) exposure to other viral communicable diseases: Secondary | ICD-10-CM | POA: Insufficient documentation

## 2019-02-14 LAB — NOVEL CORONAVIRUS, NAA (HOSP ORDER, SEND-OUT TO REF LAB; TAT 18-24 HRS): SARS-CoV-2, NAA: NOT DETECTED

## 2019-02-16 ENCOUNTER — Ambulatory Visit
Admission: RE | Admit: 2019-02-16 | Discharge: 2019-02-16 | Disposition: A | Discharge status: Home / Self Care | Payer: 59 | Source: Ambulatory Visit | Attending: Obstetrics & Gynecology | Admitting: Obstetrics & Gynecology

## 2019-02-16 ENCOUNTER — Other Ambulatory Visit: Payer: Self-pay | Admitting: Obstetrics and Gynecology

## 2019-02-16 ENCOUNTER — Other Ambulatory Visit: Payer: Self-pay

## 2019-02-16 DIAGNOSIS — Z1231 Encounter for screening mammogram for malignant neoplasm of breast: Secondary | ICD-10-CM

## 2019-02-16 NOTE — Progress Notes (Signed)
Dr. Landry Mellow aware that we need consent order and will try to put it in today, and will have it in tomorrow am at the latest.

## 2019-02-16 NOTE — Anesthesia Preprocedure Evaluation (Addendum)
Anesthesia Evaluation  Patient identified by MRN, date of birth, ID band Patient awake    Reviewed: Allergy & Precautions, NPO status , Patient's Chart, lab work & pertinent test results  Airway Mallampati: II  TM Distance: >3 FB Neck ROM: Full    Dental no notable dental hx. (+) Teeth Intact, Dental Advisory Given   Pulmonary neg pulmonary ROS,    Pulmonary exam normal breath sounds clear to auscultation       Cardiovascular negative cardio ROS Normal cardiovascular exam Rhythm:Regular Rate:Normal  TTE 2019 unremarkable  Holter Monitor 2019 80 premature ventricular contractions and 80 premature atrial contractions   No atrial fibrillation, no pauses   Normal sinus rhythm/ sinus tachycardia, with average heart rate of 116bpm, however minimum heart rate of 92bpm at 2am. Trend during sleep was about 99bpm    Reassuring monitor with mildly increased heart rate on average. Would continue to recommend conservative management.    Neuro/Psych  Headaches, PSYCHIATRIC DISORDERS Anxiety negative neurological ROS  negative psych ROS   GI/Hepatic negative GI ROS, Neg liver ROS,   Endo/Other  negative endocrine ROS  Renal/GU negative Renal ROS  negative genitourinary   Musculoskeletal negative musculoskeletal ROS (+) Arthritis ,   Abdominal   Peds  Hematology negative hematology ROS (+) Blood dyscrasia (Hgb 10.8), anemia ,   Anesthesia Other Findings   Reproductive/Obstetrics                            Anesthesia Physical Anesthesia Plan  ASA: II  Anesthesia Plan: General   Post-op Pain Management:    Induction: Intravenous  PONV Risk Score and Plan: 3 and Midazolam, Dexamethasone and Ondansetron  Airway Management Planned: Oral ETT  Additional Equipment:   Intra-op Plan:   Post-operative Plan: Extubation in OR  Informed Consent: I have reviewed the patients History and Physical,  chart, labs and discussed the procedure including the risks, benefits and alternatives for the proposed anesthesia with the patient or authorized representative who has indicated his/her understanding and acceptance.     Dental advisory given  Plan Discussed with: CRNA  Anesthesia Plan Comments:         Anesthesia Quick Evaluation

## 2019-02-17 ENCOUNTER — Encounter (HOSPITAL_BASED_OUTPATIENT_CLINIC_OR_DEPARTMENT_OTHER)
Admission: RE | Disposition: A | Discharge status: Home / Self Care | Payer: Self-pay | Source: Home / Self Care | Attending: Obstetrics and Gynecology

## 2019-02-17 ENCOUNTER — Ambulatory Visit (HOSPITAL_BASED_OUTPATIENT_CLINIC_OR_DEPARTMENT_OTHER): Payer: 59 | Admitting: Physician Assistant

## 2019-02-17 ENCOUNTER — Ambulatory Visit (HOSPITAL_BASED_OUTPATIENT_CLINIC_OR_DEPARTMENT_OTHER): Payer: 59 | Admitting: Certified Registered"

## 2019-02-17 ENCOUNTER — Encounter (HOSPITAL_BASED_OUTPATIENT_CLINIC_OR_DEPARTMENT_OTHER): Payer: Self-pay | Admitting: General Practice

## 2019-02-17 ENCOUNTER — Observation Stay (HOSPITAL_BASED_OUTPATIENT_CLINIC_OR_DEPARTMENT_OTHER)
Admission: RE | Admit: 2019-02-17 | Discharge: 2019-02-18 | Disposition: A | Discharge status: Home / Self Care | Payer: 59 | Attending: Obstetrics and Gynecology | Admitting: Obstetrics and Gynecology

## 2019-02-17 DIAGNOSIS — H53149 Visual discomfort, unspecified: Secondary | ICD-10-CM | POA: Insufficient documentation

## 2019-02-17 DIAGNOSIS — M542 Cervicalgia: Secondary | ICD-10-CM | POA: Insufficient documentation

## 2019-02-17 DIAGNOSIS — N921 Excessive and frequent menstruation with irregular cycle: Secondary | ICD-10-CM | POA: Diagnosis not present

## 2019-02-17 DIAGNOSIS — F419 Anxiety disorder, unspecified: Secondary | ICD-10-CM | POA: Diagnosis not present

## 2019-02-17 DIAGNOSIS — D252 Subserosal leiomyoma of uterus: Secondary | ICD-10-CM | POA: Diagnosis not present

## 2019-02-17 DIAGNOSIS — Z833 Family history of diabetes mellitus: Secondary | ICD-10-CM | POA: Insufficient documentation

## 2019-02-17 DIAGNOSIS — Z800 Family history of malignant neoplasm of digestive organs: Secondary | ICD-10-CM | POA: Insufficient documentation

## 2019-02-17 DIAGNOSIS — D509 Iron deficiency anemia, unspecified: Secondary | ICD-10-CM | POA: Diagnosis not present

## 2019-02-17 DIAGNOSIS — R9389 Abnormal findings on diagnostic imaging of other specified body structures: Secondary | ICD-10-CM | POA: Insufficient documentation

## 2019-02-17 DIAGNOSIS — Z79899 Other long term (current) drug therapy: Secondary | ICD-10-CM | POA: Diagnosis not present

## 2019-02-17 DIAGNOSIS — N800 Endometriosis of uterus: Principal | ICD-10-CM | POA: Insufficient documentation

## 2019-02-17 DIAGNOSIS — N838 Other noninflammatory disorders of ovary, fallopian tube and broad ligament: Secondary | ICD-10-CM | POA: Insufficient documentation

## 2019-02-17 DIAGNOSIS — G43909 Migraine, unspecified, not intractable, without status migrainosus: Secondary | ICD-10-CM | POA: Diagnosis not present

## 2019-02-17 DIAGNOSIS — Z8249 Family history of ischemic heart disease and other diseases of the circulatory system: Secondary | ICD-10-CM | POA: Insufficient documentation

## 2019-02-17 DIAGNOSIS — Z90710 Acquired absence of both cervix and uterus: Secondary | ICD-10-CM | POA: Diagnosis present

## 2019-02-17 DIAGNOSIS — N898 Other specified noninflammatory disorders of vagina: Secondary | ICD-10-CM | POA: Diagnosis not present

## 2019-02-17 HISTORY — PX: ROBOTIC ASSISTED LAPAROSCOPIC HYSTERECTOMY AND SALPINGECTOMY: SHX6379

## 2019-02-17 LAB — POCT PREGNANCY, URINE: Preg Test, Ur: NEGATIVE

## 2019-02-17 LAB — TYPE AND SCREEN
ABO/RH(D): O POS
Antibody Screen: NEGATIVE

## 2019-02-17 SURGERY — XI ROBOTIC ASSISTED LAPAROSCOPIC HYSTERECTOMY AND SALPINGECTOMY
Anesthesia: General | Site: Abdomen | Laterality: Bilateral

## 2019-02-17 MED ORDER — IBUPROFEN 200 MG PO TABS
ORAL_TABLET | ORAL | Status: AC
  Filled 2019-02-17: qty 4

## 2019-02-17 MED ORDER — MIDAZOLAM HCL 2 MG/2ML IJ SOLN
INTRAMUSCULAR | Status: AC
  Filled 2019-02-17: qty 2

## 2019-02-17 MED ORDER — ALUM & MAG HYDROXIDE-SIMETH 200-200-20 MG/5ML PO SUSP
30.0000 mL | ORAL | Status: DC | PRN
  Filled 2019-02-17: qty 30

## 2019-02-17 MED ORDER — PHENYLEPHRINE 40 MCG/ML (10ML) SYRINGE FOR IV PUSH (FOR BLOOD PRESSURE SUPPORT)
PREFILLED_SYRINGE | INTRAVENOUS | Status: DC | PRN
  Administered 2019-02-17 (×2): 80 ug via INTRAVENOUS

## 2019-02-17 MED ORDER — PHENYLEPHRINE 40 MCG/ML (10ML) SYRINGE FOR IV PUSH (FOR BLOOD PRESSURE SUPPORT)
PREFILLED_SYRINGE | INTRAVENOUS | Status: AC
  Filled 2019-02-17: qty 10

## 2019-02-17 MED ORDER — CYCLOBENZAPRINE HCL 10 MG PO TABS
10.0000 mg | ORAL_TABLET | Freq: Three times a day (TID) | ORAL | Status: DC | PRN
  Administered 2019-02-17 (×2): 10 mg via ORAL
  Filled 2019-02-17: qty 2
  Filled 2019-02-17: qty 1
  Filled 2019-02-17: qty 2

## 2019-02-17 MED ORDER — ACETAMINOPHEN 500 MG PO TABS
1000.0000 mg | ORAL_TABLET | Freq: Once | ORAL | Status: DC
  Filled 2019-02-17: qty 2

## 2019-02-17 MED ORDER — OXYCODONE HCL 5 MG PO TABS
ORAL_TABLET | ORAL | Status: AC
  Filled 2019-02-17: qty 1

## 2019-02-17 MED ORDER — SODIUM CHLORIDE 0.9 % IR SOLN
Status: DC | PRN
  Administered 2019-02-17: 3000 mL

## 2019-02-17 MED ORDER — PANTOPRAZOLE SODIUM 40 MG PO TBEC
40.0000 mg | DELAYED_RELEASE_TABLET | Freq: Every day | ORAL | Status: DC
  Filled 2019-02-17: qty 1

## 2019-02-17 MED ORDER — LIDOCAINE 2% (20 MG/ML) 5 ML SYRINGE
INTRAMUSCULAR | Status: AC
  Filled 2019-02-17: qty 5

## 2019-02-17 MED ORDER — SODIUM CHLORIDE 0.9 % IV SOLN
2.0000 g | INTRAVENOUS | Status: DC
  Filled 2019-02-17: qty 2

## 2019-02-17 MED ORDER — OXYCODONE HCL 5 MG PO TABS
5.0000 mg | ORAL_TABLET | ORAL | Status: DC | PRN
  Administered 2019-02-17 (×3): 5 mg via ORAL
  Administered 2019-02-18 (×2): 10 mg via ORAL
  Filled 2019-02-17: qty 2

## 2019-02-17 MED ORDER — ACETAMINOPHEN 325 MG PO TABS
ORAL_TABLET | ORAL | Status: AC
  Filled 2019-02-17: qty 2

## 2019-02-17 MED ORDER — PROPOFOL 10 MG/ML IV BOLUS
INTRAVENOUS | Status: AC
  Filled 2019-02-17: qty 20

## 2019-02-17 MED ORDER — ONDANSETRON HCL 4 MG/2ML IJ SOLN
INTRAMUSCULAR | Status: AC
  Filled 2019-02-17: qty 2

## 2019-02-17 MED ORDER — KETOROLAC TROMETHAMINE 30 MG/ML IJ SOLN
30.0000 mg | Freq: Once | INTRAMUSCULAR | Status: AC
  Administered 2019-02-17: 30 mg via INTRAVENOUS
  Filled 2019-02-17: qty 1

## 2019-02-17 MED ORDER — HYDROMORPHONE HCL 1 MG/ML IJ SOLN
0.2000 mg | INTRAMUSCULAR | Status: DC | PRN
  Administered 2019-02-17 (×2): 0.5 mg via INTRAVENOUS
  Administered 2019-02-17 (×2): 0.6 mg via INTRAVENOUS
  Filled 2019-02-17: qty 1

## 2019-02-17 MED ORDER — FENTANYL CITRATE (PF) 250 MCG/5ML IJ SOLN
INTRAMUSCULAR | Status: AC
  Filled 2019-02-17: qty 5

## 2019-02-17 MED ORDER — STERILE WATER FOR IRRIGATION IR SOLN
Status: DC | PRN
  Administered 2019-02-17: 3000 mL via INTRAVESICAL

## 2019-02-17 MED ORDER — METHYLENE BLUE 0.5 % INJ SOLN
INTRAVENOUS | Status: DC | PRN
  Administered 2019-02-17: 5 mL

## 2019-02-17 MED ORDER — HYDROMORPHONE HCL 1 MG/ML IJ SOLN
INTRAMUSCULAR | Status: AC
  Filled 2019-02-17: qty 1

## 2019-02-17 MED ORDER — ROPIVACAINE HCL 5 MG/ML IJ SOLN
INTRAMUSCULAR | Status: AC
  Filled 2019-02-17: qty 60

## 2019-02-17 MED ORDER — ONDANSETRON HCL 4 MG/2ML IJ SOLN
INTRAMUSCULAR | Status: DC | PRN
  Administered 2019-02-17: 4 mg via INTRAVENOUS

## 2019-02-17 MED ORDER — EPHEDRINE 5 MG/ML INJ
INTRAVENOUS | Status: AC
  Filled 2019-02-17: qty 10

## 2019-02-17 MED ORDER — DEXAMETHASONE SODIUM PHOSPHATE 10 MG/ML IJ SOLN
INTRAMUSCULAR | Status: DC | PRN
  Administered 2019-02-17: 5 mg via INTRAVENOUS

## 2019-02-17 MED ORDER — CEFAZOLIN SODIUM-DEXTROSE 2-3 GM-%(50ML) IV SOLR
INTRAVENOUS | Status: DC | PRN
  Administered 2019-02-17: 2 g via INTRAVENOUS

## 2019-02-17 MED ORDER — GABAPENTIN 300 MG PO CAPS
300.0000 mg | ORAL_CAPSULE | Freq: Three times a day (TID) | ORAL | Status: DC
  Administered 2019-02-17 (×2): 300 mg via ORAL
  Filled 2019-02-17: qty 2
  Filled 2019-02-17: qty 1
  Filled 2019-02-17: qty 2

## 2019-02-17 MED ORDER — ACETAMINOPHEN 500 MG PO TABS
ORAL_TABLET | ORAL | Status: AC
  Filled 2019-02-17: qty 2

## 2019-02-17 MED ORDER — LACTATED RINGERS IV SOLN
INTRAVENOUS | Status: DC
  Administered 2019-02-17 (×2): via INTRAVENOUS
  Filled 2019-02-17: qty 1000

## 2019-02-17 MED ORDER — FENTANYL CITRATE (PF) 250 MCG/5ML IJ SOLN
INTRAMUSCULAR | Status: DC | PRN
  Administered 2019-02-17: 100 ug via INTRAVENOUS
  Administered 2019-02-17 (×3): 50 ug via INTRAVENOUS

## 2019-02-17 MED ORDER — METHYLENE BLUE 0.5 % INJ SOLN
INTRAVENOUS | Status: AC
  Filled 2019-02-17: qty 10

## 2019-02-17 MED ORDER — ROCURONIUM BROMIDE 10 MG/ML (PF) SYRINGE
PREFILLED_SYRINGE | INTRAVENOUS | Status: DC | PRN
  Administered 2019-02-17 (×2): 20 mg via INTRAVENOUS
  Administered 2019-02-17 (×2): 10 mg via INTRAVENOUS
  Administered 2019-02-17: 80 mg via INTRAVENOUS

## 2019-02-17 MED ORDER — FENTANYL CITRATE (PF) 100 MCG/2ML IJ SOLN
INTRAMUSCULAR | Status: AC
  Filled 2019-02-17: qty 2

## 2019-02-17 MED ORDER — SODIUM CHLORIDE (PF) 0.9 % IJ SOLN
INTRAMUSCULAR | Status: AC
  Filled 2019-02-17: qty 100

## 2019-02-17 MED ORDER — PROPOFOL 10 MG/ML IV BOLUS
INTRAVENOUS | Status: DC | PRN
  Administered 2019-02-17: 200 mg via INTRAVENOUS

## 2019-02-17 MED ORDER — ROCURONIUM BROMIDE 10 MG/ML (PF) SYRINGE
PREFILLED_SYRINGE | INTRAVENOUS | Status: AC
  Filled 2019-02-17: qty 10

## 2019-02-17 MED ORDER — SODIUM CHLORIDE 0.9 % IV SOLN
INTRAVENOUS | Status: DC | PRN
  Administered 2019-02-17: 120 mL

## 2019-02-17 MED ORDER — SUCCINYLCHOLINE CHLORIDE 200 MG/10ML IV SOSY
PREFILLED_SYRINGE | INTRAVENOUS | Status: AC
  Filled 2019-02-17: qty 10

## 2019-02-17 MED ORDER — ZOLPIDEM TARTRATE 5 MG PO TABS
5.0000 mg | ORAL_TABLET | Freq: Every evening | ORAL | Status: DC | PRN
  Filled 2019-02-17: qty 1

## 2019-02-17 MED ORDER — SIMETHICONE 80 MG PO CHEW
80.0000 mg | CHEWABLE_TABLET | Freq: Four times a day (QID) | ORAL | Status: DC | PRN
  Administered 2019-02-17 – 2019-02-18 (×2): 80 mg via ORAL
  Filled 2019-02-17: qty 1

## 2019-02-17 MED ORDER — ACETAMINOPHEN 500 MG PO TABS
1000.0000 mg | ORAL_TABLET | ORAL | Status: AC
  Administered 2019-02-17: 1000 mg via ORAL
  Filled 2019-02-17: qty 2

## 2019-02-17 MED ORDER — KETOROLAC TROMETHAMINE 30 MG/ML IJ SOLN
INTRAMUSCULAR | Status: AC
  Filled 2019-02-17: qty 1

## 2019-02-17 MED ORDER — FENTANYL CITRATE (PF) 100 MCG/2ML IJ SOLN
25.0000 ug | INTRAMUSCULAR | Status: DC | PRN
  Administered 2019-02-17: 50 ug via INTRAVENOUS
  Administered 2019-02-17: 25 ug via INTRAVENOUS
  Administered 2019-02-17: 50 ug via INTRAVENOUS
  Administered 2019-02-17: 25 ug via INTRAVENOUS
  Filled 2019-02-17: qty 1

## 2019-02-17 MED ORDER — IBUPROFEN 800 MG PO TABS
800.0000 mg | ORAL_TABLET | Freq: Three times a day (TID) | ORAL | Status: DC
  Administered 2019-02-17 – 2019-02-18 (×2): 800 mg via ORAL
  Filled 2019-02-17: qty 1

## 2019-02-17 MED ORDER — ONDANSETRON HCL 4 MG PO TABS
4.0000 mg | ORAL_TABLET | Freq: Four times a day (QID) | ORAL | Status: DC | PRN
  Filled 2019-02-17: qty 1

## 2019-02-17 MED ORDER — VORTIOXETINE HBR 10 MG PO TABS
10.0000 mg | ORAL_TABLET | Freq: Every day | ORAL | Status: DC
  Filled 2019-02-17: qty 1

## 2019-02-17 MED ORDER — PANTOPRAZOLE SODIUM 40 MG PO TBEC
DELAYED_RELEASE_TABLET | ORAL | Status: AC
  Filled 2019-02-17: qty 1

## 2019-02-17 MED ORDER — ONDANSETRON HCL 4 MG/2ML IJ SOLN
4.0000 mg | Freq: Four times a day (QID) | INTRAMUSCULAR | Status: DC | PRN
  Filled 2019-02-17: qty 2

## 2019-02-17 MED ORDER — MIDAZOLAM HCL 5 MG/5ML IJ SOLN
INTRAMUSCULAR | Status: DC | PRN
  Administered 2019-02-17: 2 mg via INTRAVENOUS

## 2019-02-17 MED ORDER — LACTATED RINGERS IV SOLN
INTRAVENOUS | Status: DC
  Administered 2019-02-17: 18:00:00 via INTRAVENOUS
  Filled 2019-02-17 (×2): qty 1000

## 2019-02-17 MED ORDER — MENTHOL 3 MG MT LOZG
1.0000 | LOZENGE | OROMUCOSAL | Status: DC | PRN
  Filled 2019-02-17: qty 9

## 2019-02-17 MED ORDER — ACETAMINOPHEN 325 MG PO TABS
650.0000 mg | ORAL_TABLET | ORAL | Status: DC | PRN
  Administered 2019-02-17 – 2019-02-18 (×2): 650 mg via ORAL
  Filled 2019-02-17: qty 2

## 2019-02-17 MED ORDER — DEXAMETHASONE SODIUM PHOSPHATE 10 MG/ML IJ SOLN
INTRAMUSCULAR | Status: AC
  Filled 2019-02-17: qty 1

## 2019-02-17 MED ORDER — SUGAMMADEX SODIUM 200 MG/2ML IV SOLN
INTRAVENOUS | Status: DC | PRN
  Administered 2019-02-17: 200 mg via INTRAVENOUS

## 2019-02-17 MED ORDER — LIDOCAINE 2% (20 MG/ML) 5 ML SYRINGE
INTRAMUSCULAR | Status: DC | PRN
  Administered 2019-02-17: 60 mg via INTRAVENOUS

## 2019-02-17 MED ORDER — SIMETHICONE 80 MG PO CHEW
CHEWABLE_TABLET | ORAL | Status: AC
  Filled 2019-02-17: qty 1

## 2019-02-17 MED ORDER — CEFAZOLIN SODIUM-DEXTROSE 2-4 GM/100ML-% IV SOLN
INTRAVENOUS | Status: AC
  Filled 2019-02-17: qty 100

## 2019-02-17 SURGICAL SUPPLY — 61 items
ADH SKN CLS APL DERMABOND .7 (GAUZE/BANDAGES/DRESSINGS) ×1
APL SRG 38 LTWT LNG FL B (MISCELLANEOUS) ×1
APPLICATOR ARISTA FLEXITIP XL (MISCELLANEOUS) ×2 IMPLANT
BARRIER ADHS 3X4 INTERCEED (GAUZE/BANDAGES/DRESSINGS) IMPLANT
BRR ADH 4X3 ABS CNTRL BYND (GAUZE/BANDAGES/DRESSINGS)
CATH FOLEY 3WAY  5CC 16FR (CATHETERS) ×2
CATH FOLEY 3WAY 5CC 16FR (CATHETERS) ×1 IMPLANT
COVER BACK TABLE 60X90IN (DRAPES) ×3 IMPLANT
COVER TIP SHEARS 8 DVNC (MISCELLANEOUS) ×1 IMPLANT
COVER TIP SHEARS 8MM DA VINCI (MISCELLANEOUS) ×2
DECANTER SPIKE VIAL GLASS SM (MISCELLANEOUS) ×6 IMPLANT
DEFOGGER SCOPE WARMER CLEARIFY (MISCELLANEOUS) ×3 IMPLANT
DERMABOND ADVANCED (GAUZE/BANDAGES/DRESSINGS) ×2
DERMABOND ADVANCED .7 DNX12 (GAUZE/BANDAGES/DRESSINGS) ×1 IMPLANT
DILATOR CANAL MILEX (MISCELLANEOUS) ×3 IMPLANT
DRAPE ARM DVNC X/XI (DISPOSABLE) ×4 IMPLANT
DRAPE COLUMN DVNC XI (DISPOSABLE) ×1 IMPLANT
DRAPE DA VINCI XI ARM (DISPOSABLE) ×8
DRAPE DA VINCI XI COLUMN (DISPOSABLE) ×2
DURAPREP 26ML APPLICATOR (WOUND CARE) ×3 IMPLANT
ELECT REM PT RETURN 9FT ADLT (ELECTROSURGICAL) ×3
ELECTRODE REM PT RTRN 9FT ADLT (ELECTROSURGICAL) ×1 IMPLANT
GLOVE BIO SURGEON STRL SZ7 (GLOVE) ×3 IMPLANT
GLOVE BIOGEL M 6.5 STRL (GLOVE) ×9 IMPLANT
GLOVE BIOGEL PI IND STRL 7.0 (GLOVE) ×6 IMPLANT
GLOVE BIOGEL PI INDICATOR 7.0 (GLOVE) ×12
HEMOSTAT ARISTA ABSORB 3G PWDR (HEMOSTASIS) ×4 IMPLANT
IRRIG SUCT STRYKERFLOW 2 WTIP (MISCELLANEOUS) ×3
IRRIGATION SUCT STRKRFLW 2 WTP (MISCELLANEOUS) ×1 IMPLANT
LEGGING LITHOTOMY PAIR STRL (DRAPES) ×3 IMPLANT
OBTURATOR OPTICAL STANDARD 8MM (TROCAR) ×2
OBTURATOR OPTICAL STND 8 DVNC (TROCAR) ×1
OBTURATOR OPTICALSTD 8 DVNC (TROCAR) IMPLANT
OCCLUDER COLPOPNEUMO (BALLOONS) ×3 IMPLANT
PACK ROBOT WH (CUSTOM PROCEDURE TRAY) ×3 IMPLANT
PACK ROBOTIC GOWN (GOWN DISPOSABLE) ×3 IMPLANT
PACK TRENDGUARD 450 HYBRID PRO (MISCELLANEOUS) IMPLANT
PAD PREP 24X48 CUFFED NSTRL (MISCELLANEOUS) ×3 IMPLANT
PROTECTOR NERVE ULNAR (MISCELLANEOUS) ×3 IMPLANT
SEAL CANN UNIV 5-8 DVNC XI (MISCELLANEOUS) ×4 IMPLANT
SEAL XI 5MM-8MM UNIVERSAL (MISCELLANEOUS) ×8
SEALER VESSEL DA VINCI XI (MISCELLANEOUS) ×2
SEALER VESSEL EXT DVNC XI (MISCELLANEOUS) IMPLANT
SET CYSTO W/LG BORE CLAMP LF (SET/KITS/TRAYS/PACK) ×2 IMPLANT
SET TRI-LUMEN FLTR TB AIRSEAL (TUBING) IMPLANT
SUT VIC AB 0 CT1 27 (SUTURE) ×6
SUT VIC AB 0 CT1 27XBRD ANBCTR (SUTURE) ×2 IMPLANT
SUT VICRYL 0 UR6 27IN ABS (SUTURE) ×2 IMPLANT
SUT VICRYL RAPIDE 4/0 PS 2 (SUTURE) ×9 IMPLANT
SUT VLOC 180 0 9IN  GS21 (SUTURE) ×4
SUT VLOC 180 0 9IN GS21 (SUTURE) ×1 IMPLANT
TIP RUMI ORANGE 6.7MMX12CM (TIP) IMPLANT
TIP UTERINE 5.1X6CM LAV DISP (MISCELLANEOUS) IMPLANT
TIP UTERINE 6.7X10CM GRN DISP (MISCELLANEOUS) IMPLANT
TIP UTERINE 6.7X6CM WHT DISP (MISCELLANEOUS) IMPLANT
TIP UTERINE 6.7X8CM BLUE DISP (MISCELLANEOUS) ×2 IMPLANT
TOWEL OR 17X26 10 PK STRL BLUE (TOWEL DISPOSABLE) ×6 IMPLANT
TRENDGUARD 450 HYBRID PRO PACK (MISCELLANEOUS) ×3
TROCAR PORT AIRSEAL 8X120 (TROCAR) ×3 IMPLANT
TROCAR XCEL NON BLADE 8MM B8LT (ENDOMECHANICALS) ×2 IMPLANT
WATER STERILE IRR 1000ML POUR (IV SOLUTION) ×3 IMPLANT

## 2019-02-17 NOTE — Anesthesia Postprocedure Evaluation (Signed)
Anesthesia Post Note  Patient: Melody Sanchez  Procedure(s) Performed: XI ROBOTIC ASSISTED LAPAROSCOPIC HYSTERECTOMY WITH BILATERAL  SALPINGECTOMY (Bilateral Abdomen)     Patient location during evaluation: PACU Anesthesia Type: General Level of consciousness: awake and alert Pain management: pain level controlled Vital Signs Assessment: post-procedure vital signs reviewed and stable Respiratory status: spontaneous breathing, nonlabored ventilation, respiratory function stable and patient connected to nasal cannula oxygen Cardiovascular status: blood pressure returned to baseline and stable Postop Assessment: no apparent nausea or vomiting Anesthetic complications: no    Last Vitals:  Vitals:   02/17/19 1400 02/17/19 1415  BP: (!) 153/82 (!) 148/86  Pulse: (!) 115   Resp: 11   Temp:  36.6 C  SpO2: 100%     Last Pain:  Vitals:   02/17/19 1415  TempSrc:   PainSc: 5                  Bobbie Virden DAVID

## 2019-02-17 NOTE — Transfer of Care (Signed)
Immediate Anesthesia Transfer of Care Note  Patient: Melody Sanchez  Procedure(s) Performed: XI ROBOTIC ASSISTED LAPAROSCOPIC HYSTERECTOMY WITH BILATERAL  SALPINGECTOMY (Bilateral Abdomen)  Patient Location: PACU  Anesthesia Type:General  Level of Consciousness: awake, alert , oriented and patient cooperative  Airway & Oxygen Therapy: Patient Spontanous Breathing and Patient connected to nasal cannula oxygen  Post-op Assessment: Report given to RN, Post -op Vital signs reviewed and stable and Patient moving all extremities  Post vital signs: Reviewed and stable  Last Vitals:  Vitals Value Taken Time  BP 147/94 02/17/19 1245  Temp 36.6 C 02/17/19 1245  Pulse 115 02/17/19 1247  Resp 11 02/17/19 1247  SpO2 100 % 02/17/19 1247  Vitals shown include unvalidated device data.  Last Pain:  Vitals:   02/17/19 0710  TempSrc:   PainSc: 0-No pain      Patients Stated Pain Goal: 3 ([id number])  Complications: No apparent anesthesia complications

## 2019-02-17 NOTE — Op Note (Signed)
02/17/2019  12:32 PM  PATIENT:  Melody Sanchez  48 y.o. female  PRE-OPERATIVE DIAGNOSIS:  D21.9 Fibroids  POST-OPERATIVE DIAGNOSIS:  Fibroids  PROCEDURE:  Procedure(s): XI ROBOTIC ASSISTED LAPAROSCOPIC HYSTERECTOMY WITH BILATERAL  SALPINGECTOMY (Bilateral)  SURGEON:  Surgeon(s) and Role:    Christophe Louis, MD - Primary    * Janyth Pupa, DO - Assisting due to concern for adhesive disease  PHYSICIAN ASSISTANT: none  ASSISTANTS: Dr. Janyth Pupa assisted due to concern for adhesive disease    ANESTHESIA:   general  EBL:  250 mL   BLOOD ADMINISTERED:none  DRAINS: Urinary Catheter (Foley)   LOCAL MEDICATIONS USED:  OTHER ropivicaine   SPECIMEN:  Source of Specimen:  uterus cervix and bilateral fallopian tubes   DISPOSITION OF SPECIMEN:  PATHOLOGY  COUNTS:  YES  TOURNIQUET:  * No tourniquets in log *  DICTATION: .Dragon Dictation  PLAN OF CARE: Admit for overnight observation  PATIENT DISPOSITION:  PACU - hemodynamically stable.   Delay start of Pharmacological VTE agent (>24hrs) due to surgical blood loss or risk of bleeding: not applicable  Findings: Large fibroid uterus. Normal fallopian tubes and ovaries bilaterally. Adhesions of the bladder to the lower uterine segment.   Procedure: The patient was taken to the operating room where she was placed under general anesthesia.Time out was performed. Marland Kitchen She was placed in dorsal lithotomy position and prepped and draped in the usual sterile fashion. A weighted speculum was placed into the vagina. A Deaver was placed anteriorly for retraction. The anterior lip of the cervix was grasped with a single-tooth tenaculum. The vaginal mucosa was injected with 2.5 cc of ropivacaine at the 2/4/ 8 and 10 o'clock positions. The uterus was sounded to 8 cm. the cervix was dilated to 6 mm . 0 vicryl suture placed at the 12 and 6:00 positions Of the cervix to facilitate placement of a Ru mi uterine manipulator. The manipulator was placed  without difficulty. Weighted speculum and Deaver were removed .    Attention was turned to the patient's abdomen where a 8 mm trocar was placed 5 cm above the umbilicus. under direct visualization . The pneumoperitoneum was achieved with PCO2 gas. The laparoscope was removed. 60 cc of ropivacaine were injected into the abdominal cavity. The laparoscope was reinserted. An 8 mm trocar was placed in the right upper quadrant 16 centimeters from the umbilicus.later connected to robotic arm #4). An 8MM incision was made in the Right upper quadrant TROCAR WAS PLACED 8 cm from the umbilicus. Later connected to robotic arm #3. An 8 mm incision was made in the left upper quadrant 16 cm from the umbilicus and connected to robot arm #1. Marland Kitchen Attention was turned to the left upper quadrant where a 8 mm midclavicular assistant trocar was placed. ( All incision sites were injected with 10cc of ropivacaine prior to port placement. )  Once all ports had been placed under direct visualization.The laparoscope was removed and the [place] robotic system was thin right-sided docked. The robotic arms were connected to the corresponding trocars as listed above. The laparoscope was then reinserted. The long tip bipolar forceps were placed into port #1. The  prograsp placed in the port #4. A vessel sealer was placed in port #3. All instruments were directed into the pelvis under direct visualization.  Attention was turned to the surgeons console.. The left mesosalpinx and left utero-ovarian ligament was cauterized and transected with the vessel sealer The broad ligament was cauterized and transected with  the vessel sealer .The round ligament was cauterized and transected with the vessel sealer  The anterior leaf of broad ligament was incised along the bladder reflection to the midline.  The right  mesosalpinx and right utero-ovarian ligament was cauterized and transected with the vessel sealer. The right broad ligament was cauterized and  transected with the vessel sealer. The right round ligament was cauterized and transected with the vessel sealer The broad ligament was incised to the midline.  The bladder was filled in a retrograde fashion with sterile saline stained with methylene blue. The bladder was dissected off the lower uterine segments of the cervix via sharp and blunt dissection.   The uterine arteries were skeleton bilaterally. They were  cauterized and transected with the vessel sealer The KOH ring was identified. The anterior colpotomy was performed followed by the posterior colpotomy. Once the uterus,cervix and bilateral fallopian tubes were completely excised it was bivalved with the robotic hook due to the large size. It was then removed through the vagina. The  bipolar forceps and scissors were removed and log tip forceps were placed in the port #1 and the cutting needle driver was placed in to port #3.  The vaginal cuff was closed with running suture if 0 v-lock. The pelvis was irrigated. Marland KitchenMarland KitchenMarland KitchenExcellent hemostasis was noted. Arista was placed along the vaginal cuff.  All pelvic pedicles were examined and hemostasis was noted.  All instruments removed from the ports. All ports were removed under direct Visualization. The pneumoperitoneum was released. The skin incisions were closed with 4-0 Vicryl and then covered with Derma bond.     Sponge lap and needle counts weIre correct x 2 . The patient was awakened from anesthesia and taken to the recovery room in stable condition.

## 2019-02-17 NOTE — Anesthesia Procedure Notes (Signed)
Procedure Name: Intubation Date/Time: 02/17/2019 8:37 AM Performed by: Myna Bright, CRNA Pre-anesthesia Checklist: Patient identified, Emergency Drugs available, Suction available and Patient being monitored Patient Re-evaluated:Patient Re-evaluated prior to induction Oxygen Delivery Method: Circle system utilized Preoxygenation: Pre-oxygenation with 100% oxygen Induction Type: IV induction Ventilation: Mask ventilation without difficulty Laryngoscope Size: Mac and 3 Grade View: Grade I Tube type: Oral Tube size: 7.0 mm Number of attempts: 1 Airway Equipment and Method: Stylet Placement Confirmation: ETT inserted through vocal cords under direct vision,  positive ETCO2 and breath sounds checked- equal and bilateral Secured at: 21 cm Tube secured with: Tape Dental Injury: Teeth and Oropharynx as per pre-operative assessment

## 2019-02-17 NOTE — H&P (Signed)
Date of Initial H&P: 02/20/2019 History reviewed, patient examined, no change in status, stable for surgery.

## 2019-02-18 ENCOUNTER — Encounter (HOSPITAL_BASED_OUTPATIENT_CLINIC_OR_DEPARTMENT_OTHER): Payer: Self-pay | Admitting: Obstetrics and Gynecology

## 2019-02-18 DIAGNOSIS — N800 Endometriosis of uterus: Secondary | ICD-10-CM | POA: Diagnosis not present

## 2019-02-18 LAB — CBC
HCT: 26.7 % — ABNORMAL LOW (ref 36.0–46.0)
Hemoglobin: 8.4 g/dL — ABNORMAL LOW (ref 12.0–15.0)
MCH: 25.7 pg — ABNORMAL LOW (ref 26.0–34.0)
MCHC: 31.5 g/dL (ref 30.0–36.0)
MCV: 81.7 fL (ref 80.0–100.0)
Platelets: 429 10*3/uL — ABNORMAL HIGH (ref 150–400)
RBC: 3.27 MIL/uL — ABNORMAL LOW (ref 3.87–5.11)
RDW: 16.2 % — ABNORMAL HIGH (ref 11.5–15.5)
WBC: 6.5 10*3/uL (ref 4.0–10.5)
nRBC: 0 % (ref 0.0–0.2)

## 2019-02-18 LAB — SURGICAL PATHOLOGY

## 2019-02-18 MED ORDER — OXYCODONE HCL 5 MG PO TABS
ORAL_TABLET | ORAL | Status: AC
  Filled 2019-02-18: qty 2

## 2019-02-18 MED ORDER — SIMETHICONE 80 MG PO CHEW
CHEWABLE_TABLET | ORAL | Status: AC
  Filled 2019-02-18: qty 1

## 2019-02-18 MED ORDER — OXYCODONE HCL 5 MG PO TABS: 5.0000 mg | ORAL_TABLET | ORAL | 0 refills | Status: DC | PRN

## 2019-02-18 MED ORDER — ACETAMINOPHEN 325 MG PO TABS: 650.0000 mg | ORAL_TABLET | ORAL | Status: DC | PRN

## 2019-02-18 MED ORDER — ACETAMINOPHEN 325 MG PO TABS
ORAL_TABLET | ORAL | Status: AC
  Filled 2019-02-18: qty 2

## 2019-02-18 MED ORDER — IBUPROFEN 200 MG PO TABS
ORAL_TABLET | ORAL | Status: AC
  Filled 2019-02-18: qty 4

## 2019-02-26 NOTE — Discharge Summary (Signed)
Physician Discharge Summary  Patient ID: Melody Sanchez MRN: [id number] DOB/AGE: 1971-02-27 48 y.o.  Admit date: 02/17/2019 Discharge date: 02/26/2019  Admission Diagnoses: Fibroids and Menorhagia   Discharge Diagnoses:  Active Problems:   S/P hysterectomy   Discharged Condition: good  Hospital Course: pt was admitted for observation after undergoing a robotic assisted laparoscopic hysterectomy with bilateral salpingectomy. She did well postoperatively with return of bowel and bladder function   Consults: None  Significant Diagnostic Studies: labs: hgb pod #1 8.4  Treatments: surgery: robotic assisted laparoscopic hysterectomy with bilateral salpingectomy   Discharge Exam: Blood pressure 111/66, pulse (!) 115, temperature 98.3 F (36.8 C), resp. rate 18, height 5\' 2"  (1.575 m), weight 77 kg, last menstrual period 01/30/2019, SpO2 98 %. General appearance: alert, cooperative and no distress GI: normal findings: bowel sounds normal Extremities: extremities normal, atraumatic, no cyanosis or edema Incision/Wound:intact without erythema or exudate   Disposition:   Discharge Instructions    Call MD for:  persistant nausea and vomiting   Complete by: As directed    Call MD for:  redness, tenderness, or signs of infection (pain, swelling, redness, odor or green/yellow discharge around incision site)   Complete by: As directed    Call MD for:  severe uncontrolled pain   Complete by: As directed    Call MD for:  temperature >100.4   Complete by: As directed    Diet general   Complete by: As directed    Driving Restrictions   Complete by: As directed    Avoid driving for 1 week   Increase activity slowly   Complete by: As directed    Lifting restrictions   Complete by: As directed    Avoid lifting over 10 lbs   No dressing needed   Complete by: As directed    Sexual Activity Restrictions   Complete by: As directed    Avoid sex for 6 weeks     Allergies as of  02/18/2019   No Known Allergies     Medication List    TAKE these medications   acetaminophen 325 MG tablet Commonly known as: TYLENOL Take 2 tablets (650 mg total) by mouth every 4 (four) hours as needed for mild pain (temperature > 101.5.).   cyclobenzaprine 10 MG tablet Commonly known as: FLEXERIL Take 10 mg by mouth 3 (three) times daily as needed for muscle spasms.   DULoxetine 30 MG capsule Commonly known as: CYMBALTA Take 2 capsules (60 mg total) by mouth daily.   gabapentin 300 MG capsule Commonly known as: NEURONTIN Take 300 mg by mouth 3 (three) times daily.   hydroxypropyl methylcellulose / hypromellose 2.5 % ophthalmic solution Commonly known as: ISOPTO TEARS / GONIOVISC Place 1 drop into both eyes 3 (three) times daily as needed for dry eyes.   multivitamin with minerals tablet Take 1 tablet by mouth daily.   oxyCODONE 5 MG immediate release tablet Commonly known as: Oxy IR/ROXICODONE Take 1-2 tablets (5-10 mg total) by mouth every 4 (four) hours as needed for moderate pain.   Trintellix 10 MG Tabs tablet Generic drug: vortioxetine HBr Take 10 mg by mouth daily.      Follow-up Information    Christophe Louis, MD. Go in 2 week(s).   Specialty: Obstetrics and Gynecology Contact information: [id number] [place] [address] [phone]           Signed: Christophe Louis 02/26/2019, 7:24 AM

## 2019-03-04 ENCOUNTER — Encounter: Payer: Self-pay | Admitting: Family Medicine

## 2019-03-04 ENCOUNTER — Other Ambulatory Visit: Payer: Self-pay

## 2019-03-04 ENCOUNTER — Ambulatory Visit (INDEPENDENT_AMBULATORY_CARE_PROVIDER_SITE_OTHER): Payer: 59 | Admitting: Family Medicine

## 2019-03-04 VITALS — BP 122/80 | HR 120 | Temp 97.0°F | Wt 165.0 lb

## 2019-03-04 DIAGNOSIS — Z23 Encounter for immunization: Secondary | ICD-10-CM

## 2019-03-04 DIAGNOSIS — M545 Low back pain, unspecified: Secondary | ICD-10-CM

## 2019-03-04 DIAGNOSIS — Z90710 Acquired absence of both cervix and uterus: Secondary | ICD-10-CM

## 2019-03-04 DIAGNOSIS — Z13220 Encounter for screening for lipoid disorders: Secondary | ICD-10-CM

## 2019-03-04 DIAGNOSIS — R000 Tachycardia, unspecified: Secondary | ICD-10-CM

## 2019-03-04 DIAGNOSIS — G8929 Other chronic pain: Secondary | ICD-10-CM

## 2019-03-04 DIAGNOSIS — M542 Cervicalgia: Secondary | ICD-10-CM

## 2019-03-04 DIAGNOSIS — Z0000 Encounter for general adult medical examination without abnormal findings: Secondary | ICD-10-CM | POA: Diagnosis not present

## 2019-03-04 LAB — CBC WITH DIFFERENTIAL/PLATELET
Basophils Absolute: 0 10*3/uL (ref 0.0–0.1)
Basophils Relative: 1 % (ref 0.0–3.0)
Eosinophils Absolute: 0.2 10*3/uL (ref 0.0–0.7)
Eosinophils Relative: 3.4 % (ref 0.0–5.0)
HCT: 32.5 % — ABNORMAL LOW (ref 36.0–46.0)
Hemoglobin: 10.3 g/dL — ABNORMAL LOW (ref 12.0–15.0)
Lymphocytes Relative: 36.2 % (ref 12.0–46.0)
Lymphs Abs: 1.8 10*3/uL (ref 0.7–4.0)
MCHC: 31.7 g/dL (ref 30.0–36.0)
MCV: 78.3 fl (ref 78.0–100.0)
Monocytes Absolute: 0.4 10*3/uL (ref 0.1–1.0)
Monocytes Relative: 8.5 % (ref 3.0–12.0)
Neutro Abs: 2.6 10*3/uL (ref 1.4–7.7)
Neutrophils Relative %: 50.9 % (ref 43.0–77.0)
Platelets: 537 10*3/uL — ABNORMAL HIGH (ref 150.0–400.0)
RBC: 4.15 Mil/uL (ref 3.87–5.11)
RDW: 16.4 % — ABNORMAL HIGH (ref 11.5–15.5)
WBC: 5.1 10*3/uL (ref 4.0–10.5)

## 2019-03-04 LAB — BASIC METABOLIC PANEL
BUN: 12 mg/dL (ref 6–23)
CO2: 27 mEq/L (ref 19–32)
Calcium: 9.7 mg/dL (ref 8.4–10.5)
Chloride: 101 mEq/L (ref 96–112)
Creatinine, Ser: 0.84 mg/dL (ref 0.40–1.20)
GFR: 86.62 mL/min (ref >60.00)
Glucose, Bld: 93 mg/dL (ref 70–99)
Potassium: 4.4 mEq/L (ref 3.5–5.1)
Sodium: 138 mEq/L (ref 135–145)

## 2019-03-04 LAB — LIPID PANEL
Cholesterol: 255 mg/dL — ABNORMAL HIGH (ref 0–200)
HDL: 57.1 mg/dL (ref >39.00)
LDL Cholesterol: 159 mg/dL — ABNORMAL HIGH (ref 0–99)
NonHDL: 197.53
Total CHOL/HDL Ratio: 4
Triglycerides: 191 mg/dL — ABNORMAL HIGH (ref 0.0–149.0)
VLDL: 38.2 mg/dL (ref 0.0–40.0)

## 2019-03-04 LAB — HEMOGLOBIN A1C: Hgb A1c MFr Bld: 5.7 % (ref 4.6–6.5)

## 2019-03-04 NOTE — Patient Instructions (Signed)
Preventive Care 24-50 Years Old, Female Preventive care refers to visits with your health care provider and lifestyle choices that can promote health and wellness. This includes:  A yearly physical exam. This may also be called an annual well check.  Regular dental visits and eye exams.  Immunizations.  Screening for certain conditions.  Healthy lifestyle choices, such as eating a healthy diet, getting regular exercise, not using drugs or products that contain nicotine and tobacco, and limiting alcohol use. What can I expect for my preventive care visit? Physical exam Your health care provider will check your:  Height and weight. This may be used to calculate body mass index (BMI), which tells if you are at a healthy weight.  Heart rate and blood pressure.  Skin for abnormal spots. Counseling Your health care provider may ask you questions about your:  Alcohol, tobacco, and drug use.  Emotional well-being.  Home and relationship well-being.  Sexual activity.  Eating habits.  Work and work Statistician.  Method of birth control.  Menstrual cycle.  Pregnancy history. What immunizations do I need?  Influenza (flu) vaccine  This is recommended every year. Tetanus, diphtheria, and pertussis (Tdap) vaccine  You may need a Td booster every 10 years. Varicella (chickenpox) vaccine  You may need this if you have not been vaccinated. Zoster (shingles) vaccine  You may need this after age 31. Measles, mumps, and rubella (MMR) vaccine  You may need at least one dose of MMR if you were born in 1957 or later. You may also need a second dose. Pneumococcal conjugate (PCV13) vaccine  You may need this if you have certain conditions and were not previously vaccinated. Pneumococcal polysaccharide (PPSV23) vaccine  You may need one or two doses if you smoke cigarettes or if you have certain conditions. Meningococcal conjugate (MenACWY) vaccine  You may need this if you  have certain conditions. Hepatitis A vaccine  You may need this if you have certain conditions or if you travel or work in places where you may be exposed to hepatitis A. Hepatitis B vaccine  You may need this if you have certain conditions or if you travel or work in places where you may be exposed to hepatitis B. Haemophilus influenzae type b (Hib) vaccine  You may need this if you have certain conditions. Human papillomavirus (HPV) vaccine  If recommended by your health care provider, you may need three doses over 6 months. You may receive vaccines as individual doses or as more than one vaccine together in one shot (combination vaccines). Talk with your health care provider about the risks and benefits of combination vaccines. What tests do I need? Blood tests  Lipid and cholesterol levels. These may be checked every 5 years, or more frequently if you are over 51 years old.  Hepatitis C test.  Hepatitis B test. Screening  Lung cancer screening. You may have this screening every year starting at age 74 if you have a 30-pack-year history of smoking and currently smoke or have quit within the past 15 years.  Colorectal cancer screening. All adults should have this screening starting at age 53 and continuing until age 49. Your health care provider may recommend screening at age 39 if you are at increased risk. You will have tests every 1-10 years, depending on your results and the type of screening test.  Diabetes screening. This is done by checking your blood sugar (glucose) after you have not eaten for a while (fasting). You may have this  done every 1-3 years.  Mammogram. This may be done every 1-2 years. Talk with your health care provider about when you should start having regular mammograms. This may depend on whether you have a family history of breast cancer.  BRCA-related cancer screening. This may be done if you have a family history of breast, ovarian, tubal, or peritoneal  cancers.  Pelvic exam and Pap test. This may be done every 3 years starting at age 21. Starting at age 50, this may be done every 5 years if you have a Pap test in combination with an HPV test. Other tests  Sexually transmitted disease (STD) testing.  Bone density scan. This is done to screen for osteoporosis. You may have this scan if you are at high risk for osteoporosis. Follow these instructions at home: Eating and drinking  Eat a diet that includes fresh fruits and vegetables, whole grains, lean protein, and low-fat dairy.  Take vitamin and mineral supplements as recommended by your health care provider.  Do not drink alcohol if: ? Your health care provider tells you not to drink. ? You are pregnant, may be pregnant, or are planning to become pregnant.  If you drink alcohol: ? Limit how much you have to 0-1 drink a day. ? Be aware of how much alcohol is in your drink. In the [place] one drink equals one 12 oz bottle of beer (355 mL), one 5 oz glass of wine (148 mL), or one 1 oz glass of hard liquor (44 mL). Lifestyle  Take daily care of your teeth and gums.  Stay active. Exercise for at least 30 minutes on 5 or more days each week.  Do not use any products that contain nicotine or tobacco, such as cigarettes, e-cigarettes, and chewing tobacco. If you need help quitting, ask your health care provider.  If you are sexually active, practice safe sex. Use a condom or other form of birth control (contraception) in order to prevent pregnancy and STIs (sexually transmitted infections).  If told by your health care provider, take low-dose aspirin daily starting at age 50. What's next?  Visit your health care provider once a year for a well check visit.  Ask your health care provider how often you should have your eyes and teeth checked.  Stay up to date on all vaccines. This information is not intended to replace advice given to you by your health care provider. Make sure you  discuss any questions you have with your health care provider. Document Released: 05/19/2015 Document Revised: 01/01/2018 Document Reviewed: 01/01/2018 Elsevier Patient Education  2020 [employer]  Chronic Back Pain When back pain lasts longer than 3 months, it is called chronic back pain.The cause of your back pain may not be known. Some common causes include:  Wear and tear (degenerative disease) of the bones, ligaments, or disks in your back.  Inflammation and stiffness in your back (arthritis). People who have chronic back pain often go through certain periods in which the pain is more intense (flare-ups). Many people can learn to manage the pain with home care. Follow these instructions at home: Pay attention to any changes in your symptoms. Take these actions to help with your pain: Activity   Avoid bending and other activities that make the problem worse.  Maintain a proper position when standing or sitting: ? When standing, keep your upper back and neck straight, with your shoulders pulled back. Avoid slouching. ? When sitting, keep your back straight and relax your shoulders.   Do not round your shoulders or pull them backward.  Do not sit or stand in one place for long periods of time.  Take brief periods of rest throughout the day. This will reduce your pain. Resting in a lying or standing position is usually better than sitting to rest.  When you are resting for longer periods, mix in some mild activity or stretching between periods of rest. This will help to prevent stiffness and pain.  Get regular exercise. Ask your health care provider what activities are safe for you.  Do not lift anything that is heavier than 10 lb (4.5 kg). Always use proper lifting technique, which includes: ? Bending your knees. ? Keeping the load close to your body. ? Avoiding twisting.  Sleep on a firm mattress in a comfortable position. Try lying on your side with your knees slightly bent.  If you lie on your back, put a pillow under your knees. Managing pain  If directed, apply ice to the painful area. Your health care provider may recommend applying ice during the first 24-48 hours after a flare-up begins. ? Put ice in a plastic bag. ? Place a towel between your skin and the bag. ? Leave the ice on for 20 minutes, 2-3 times per day.  If directed, apply heat to the affected area as often as told by your health care provider. Use the heat source that your health care provider recommends, such as a moist heat pack or a heating pad. ? Place a towel between your skin and the heat source. ? Leave the heat on for 20-30 minutes. ? Remove the heat if your skin turns bright red. This is especially important if you are unable to feel pain, heat, or cold. You may have a greater risk of getting burned.  Try soaking in a warm tub.  Take over-the-counter and prescription medicines only as told by your health care provider.  Keep all follow-up visits as told by your health care provider. This is important. Contact a health care provider if:  You have pain that is not relieved with rest or medicine. Get help right away if:  You have weakness or numbness in one or both of your legs or feet.  You have trouble controlling your bladder or your bowels.  You have nausea or vomiting.  You have pain in your abdomen.  You have shortness of breath or you faint. This information is not intended to replace advice given to you by your health care provider. Make sure you discuss any questions you have with your health care provider. Document Released: 05/30/2004 Document Revised: 08/13/2018 Document Reviewed: 10/30/2016 Elsevier Patient Education  2020 Elsevier Inc.  Sinus Tachycardia  Sinus tachycardia is a kind of fast heartbeat. In sinus tachycardia, the heart beats more than 100 times a minute. Sinus tachycardia starts in a part of the heart called the sinus node. Sinus tachycardia may be  harmless, or it may be a sign of a serious condition. What are the causes? This condition may be caused by:  Exercise or exertion.  A fever.  Pain.  Loss of body fluids (dehydration).  Severe bleeding (hemorrhage).  Anxiety and stress.  Certain substances, including: ? Alcohol. ? Caffeine. ? Tobacco and nicotine products. ? Cold medicines. ? Illegal drugs.  Medical conditions including: ? Heart disease. ? An infection. ? An overactive thyroid (hyperthyroidism). ? A lack of red blood cells (anemia). What are the signs or symptoms? Symptoms of this condition include:  A   feeling that the heart is beating quickly (palpitations).  Suddenly noticing your heartbeat (cardiac awareness).  Dizziness.  Tiredness (fatigue).  Shortness of breath.  Chest pain.  Nausea.  Fainting. How is this diagnosed? This condition is diagnosed with:  A physical exam.  Other tests, such as: ? Blood tests. ? An electrocardiogram (ECG). This test measures the electrical activity of the heart. ? Ambulatory cardiac monitor. This records your heartbeats for 24 hours or more. You may be referred to a heart specialist (cardiologist). How is this treated? Treatment for this condition depends on the cause or the underlying condition. Treatment may involve:  Treating the underlying condition.  Taking new medicines or changing your current medicines as told by your health care provider.  Making changes to your diet or lifestyle. Follow these instructions at home: Lifestyle   Do not use any products that contain nicotine or tobacco, such as cigarettes and e-cigarettes. If you need help quitting, ask your health care provider.  Do not use illegal drugs, such as cocaine.  Learn relaxation methods to help you when you get stressed or anxious. These include deep breathing.  Avoid caffeine or other stimulants. Alcohol use   Do not drink alcohol if: ? Your health care provider tells  you not to drink. ? You are pregnant, may be pregnant, or are planning to become pregnant.  If you drink alcohol, limit how much you have: ? 0-1 drink a day for women. ? 0-2 drinks a day for men.  Be aware of how much alcohol is in your drink. In the [place] one drink equals one typical bottle of beer (12 oz), one-half glass of wine (5 oz), or one shot of hard liquor (1 oz). General instructions  Drink enough fluids to keep your urine pale yellow.  Take over-the-counter and prescription medicines only as told by your health care provider.  Keep all follow-up visits as told by your health care provider. This is important. Contact a health care provider if you have:  A fever.  Vomiting or diarrhea that does not go away. Get help right away if you:  Have pain in your chest, upper arms, jaw, or neck.  Become weak or dizzy.  Feel faint.  Have palpitations that do not go away. Summary  In sinus tachycardia, the heart beats more than 100 times a minute.  Sinus tachycardia may be harmless, or it may be a sign of a serious condition.  Treatment for this condition depends on the cause or the underlying condition.  Get help right away if you have pain in your chest, upper arms, jaw, or neck. This information is not intended to replace advice given to you by your health care provider. Make sure you discuss any questions you have with your health care provider. Document Released: 05/30/2004 Document Revised: 06/11/2017 Document Reviewed: 06/11/2017 Elsevier Patient Education  2020 [employer].

## 2019-03-04 NOTE — Progress Notes (Signed)
Subjective:     Melody Sanchez is a 48 y.o. female and is here for a comprehensive physical exam. The patient reports problems - s/p hysterectomy, neck, back, and shoulder pain.  Pt s/p robotic assisted lap hysterectomy with bilateral salpingectomy on 02/17/19 for h/o fibroids and AUB.  Pt states she is recovering, still having some pain, but is up moving around.  Pt also notes continued shoulder, back, and neck pain.  Seen by Dr[facility] Eug[facility].  Had numerous treatments including injections, trying to avoid surgery but still having pain.  Requesting referral to pain management.  Pt and her husband have been watching their new 77-month-old grandchild.  Social History   Socioeconomic History  . Marital status: Married    Spouse name: Not on file  . Number of children: Not on file  . Years of education: Not on file  . Highest education level: Not on file  Occupational History  . Not on file  Social Needs  . Financial resource strain: Not on file  . Food insecurity    Worry: Not on file    Inability: Not on file  . Transportation needs    Medical: Not on file    Non-medical: Not on file  Tobacco Use  . Smoking status: Never Smoker  . Smokeless tobacco: Never Used  Substance and Sexual Activity  . Alcohol use: Yes    Comment: ocassionally   . Drug use: No  . Sexual activity: Not on file  Lifestyle  . Physical activity    Days per week: Not on file    Minutes per session: Not on file  . Stress: Not on file  Relationships  . Social Herbalist on phone: Not on file    Gets together: Not on file    Attends religious service: Not on file    Active member of club or organization: Not on file    Attends meetings of clubs or organizations: Not on file    Relationship status: Not on file  . Intimate partner violence    Fear of current or ex partner: Not on file    Emotionally abused: Not on file    Physically abused: Not on file    Forced sexual activity:  Not on file  Other Topics Concern  . Not on file  Social History Narrative  . Not on file   Health Maintenance  Topic Date Due  . HIV Screening  07/21/1983  . TETANUS/TDAP  07/21/1987  . COLONOSCOPY  07/21/2018  . INFLUENZA VACCINE  12/05/2018  . PAP SMEAR-Modifier  11/05/2019  . MAMMOGRAM  02/15/2021    The following portions of the patient's history were reviewed and updated as appropriate: allergies, current medications, past family history, past medical history, past social history, past surgical history and problem list.  Review of Systems Pertinent items noted in HPI and remainder of comprehensive ROS otherwise negative.   Objective:    BP 122/80 (BP Location: Left Arm, Patient Position: Sitting, Cuff Size: Normal)   Pulse (!) 120   Temp (!) 97 F (36.1 C) (Temporal)   Wt 165 lb (74.8 kg)   LMP 01/14/2019 (Approximate)   SpO2 97%   BMI 30.18 kg/m  General appearance: alert, cooperative and no distress Head: Normocephalic, without obvious abnormality, atraumatic Eyes: conjunctivae/corneas clear. PERRL, EOM's intact. Fundi benign. Ears: normal TM's and external ear canals both ears Nose: Nares normal. Septum midline. Mucosa normal. No drainage or sinus tenderness.  Throat: lips, mucosa, and tongue normal; teeth and gums normal Neck: no adenopathy, no carotid bruit, no JVD, supple, symmetrical, trachea midline and thyroid not enlarged, symmetric, no tenderness/mass/nodules Lungs: clear to auscultation bilaterally Heart: regular rate and rhythm, S1, S2 normal, no murmur, click, rub or gallop Abdomen: soft, non-tender; bowel sounds normal; no masses,  no organomegaly and healing surgical incisions s/p lap hysterectomy with surgical tape residue present Extremities: extremities normal, atraumatic, no cyanosis or edema Pulses: 2+ and symmetric Skin: Skin color, texture, turgor normal. No rashes or lesions Lymph nodes: Cervical, supraclavicular, and axillary nodes  normal. Neurologic: Alert and oriented X 3, normal strength and tone. Normal symmetric reflexes. Normal coordination and gait    Assessment:    Healthy female exam.      Plan:     Anticipatory guidance given including wearing seatbelts, smoke detectors in the home, increasing physical activity, increasing p.o. intake of water and vegetables. -will obtain labs.  Recheck hgb, was 8.4 on 02/18/19 -mammogram scheduled -pap not indicated s/p hysterectomy on 02/18/19 -colonoscopy up to date.  Done early d/t family hx. -given handouts -next CPE in 1 yr See After Visit Summary for Counseling Recommendations    S/P laparoscopic hysterectomy  - Plan: CBC with Differential/Platelet, Basic metabolic panel  Chronic bilateral low back pain without sciatica  - Plan: Ambulatory referral to Pain Clinic  Chronic cervical pain  - Plan: Ambulatory referral to Pain Clinic  Tachycardia  - Plan: TSH, T4, Free, Hemoglobin A1c  Screening for cholesterol level  - Plan: Lipid panel  F/u prn in the next few months  Grier Mitts, MD

## 2019-03-05 LAB — TSH: TSH: 1.13 u[IU]/mL (ref 0.35–4.50)

## 2019-03-05 LAB — T4, FREE: Free T4: 0.71 ng/dL (ref 0.60–1.60)

## 2019-08-05 ENCOUNTER — Other Ambulatory Visit: Payer: Self-pay | Admitting: Obstetrics and Gynecology

## 2019-08-05 DIAGNOSIS — N6310 Unspecified lump in the right breast, unspecified quadrant: Secondary | ICD-10-CM

## 2019-08-18 ENCOUNTER — Ambulatory Visit
Admission: RE | Admit: 2019-08-18 | Discharge: 2019-08-18 | Disposition: A | Discharge status: Home / Self Care | Source: Ambulatory Visit | Attending: Obstetrics and Gynecology | Admitting: Obstetrics and Gynecology

## 2019-08-18 ENCOUNTER — Other Ambulatory Visit: Payer: Self-pay

## 2019-08-18 ENCOUNTER — Other Ambulatory Visit: Payer: Self-pay | Admitting: Obstetrics and Gynecology

## 2019-08-18 DIAGNOSIS — N6310 Unspecified lump in the right breast, unspecified quadrant: Secondary | ICD-10-CM

## 2019-08-27 ENCOUNTER — Ambulatory Visit
Admission: RE | Admit: 2019-08-27 | Discharge: 2019-08-27 | Disposition: A | Discharge status: Home / Self Care | Source: Ambulatory Visit | Attending: Obstetrics and Gynecology | Admitting: Obstetrics and Gynecology

## 2019-08-27 ENCOUNTER — Other Ambulatory Visit: Payer: Self-pay

## 2019-08-27 DIAGNOSIS — N6310 Unspecified lump in the right breast, unspecified quadrant: Secondary | ICD-10-CM

## 2019-08-27 HISTORY — PX: BREAST BIOPSY: SHX20

## 2019-10-27 ENCOUNTER — Other Ambulatory Visit: Payer: Self-pay | Admitting: Family Medicine

## 2019-10-27 DIAGNOSIS — F419 Anxiety disorder, unspecified: Secondary | ICD-10-CM

## 2019-10-27 NOTE — Telephone Encounter (Signed)
Left a message for pt to call office and schedule a f/u visit for med review on her cymbalta 30 mg

## 2019-11-02 NOTE — Telephone Encounter (Signed)
Pt has been scheduled for a MyChart video visit for medication refill

## 2019-11-04 ENCOUNTER — Encounter: Payer: Self-pay | Admitting: Family Medicine

## 2019-11-04 ENCOUNTER — Telehealth (INDEPENDENT_AMBULATORY_CARE_PROVIDER_SITE_OTHER): Admitting: Family Medicine

## 2019-11-04 DIAGNOSIS — F419 Anxiety disorder, unspecified: Secondary | ICD-10-CM | POA: Diagnosis not present

## 2019-11-04 MED ORDER — DULOXETINE HCL 30 MG PO CPEP: 60.0000 mg | ORAL_CAPSULE | Freq: Every day | ORAL | 3 refills | Status: DC

## 2019-11-04 NOTE — Progress Notes (Signed)
Virtual Visit via Video Note  I connected with Toniette Schuld on 11/04/19 at  9:00 AM EDT by a video enabled telemedicine application 2/2 NIOEV-03 pandemic and verified that I am speaking with the correct person using two identifiers.  Location patient: home Location provider:work or home office Persons participating in the virtual visit: patient, provider  I discussed the limitations of evaluation and management by telemedicine and the availability of in person appointments. The patient expressed understanding and agreed to proceed.   HPI: Pt is a 49 yo female with pmh sig for migraines, arthritis, h/o iron def anemia, h/o fibroid s/p hysterectomy, h/o anxiety, and chronic back and neck pain.  Pt seen for f/u and refills.  Pt notes she has been doing well.  Has a back injection scheduled for July 15.  Pt has lost 20 lbs by walking and eating smaller portions.  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  Pt states sleep, energy, and mood have been better.  Notes cymbalta has been helpful.  Pt watching her grandson.  He is now walking.  Pt considering finding a new job.  ROS: See pertinent positives and negatives per HPI.  Past Medical History:  Diagnosis Date  . Anxiety   . Arthritis    Neck, Hip  . Iron deficiency anemia   . Migraines   . Sinus tachycardia   . Uterine fibroid     Past Surgical History:  Procedure Laterality Date  . CESAREAN SECTION     x3  . LAMINECTOMY  10/2017  . ROBOTIC ASSISTED LAPAROSCOPIC HYSTERECTOMY AND SALPINGECTOMY Bilateral 02/17/2019   Procedure: XI ROBOTIC ASSISTED LAPAROSCOPIC HYSTERECTOMY WITH BILATERAL  SALPINGECTOMY;  Surgeon: Christophe Louis, MD;  Location: [facility];  Service: Gynecology;  Laterality: Bilateral;  . TUBAL LIGATION      No family history on file.    Current Outpatient Medications:  .  acetaminophen (TYLENOL) 325 MG tablet, Take 2 tablets (650 mg total) by mouth every 4 (four) hours  as needed for mild pain (temperature > 101.5.)., Disp:  , Rfl:  .  cyclobenzaprine (FLEXERIL) 10 MG tablet, Take 10 mg by mouth 3 (three) times daily as needed for muscle spasms., Disp: , Rfl:  .  DULoxetine (CYMBALTA) 30 MG capsule, Take 2 capsules (60 mg total) by mouth daily., Disp: 180 capsule, Rfl: 2 .  gabapentin (NEURONTIN) 300 MG capsule, Take 300 mg by mouth 3 (three) times daily., Disp: , Rfl:  .  hydroxypropyl methylcellulose / hypromellose (ISOPTO TEARS / GONIOVISC) 2.5 % ophthalmic solution, Place 1 drop into both eyes 3 (three) times daily as needed for dry eyes., Disp: , Rfl:  .  Multiple Vitamins-Minerals (MULTIVITAMIN WITH MINERALS) tablet, Take 1 tablet by mouth daily., Disp: , Rfl:  .  oxyCODONE (OXY IR/ROXICODONE) 5 MG immediate release tablet, Take 1-2 tablets (5-10 mg total) by mouth every 4 (four) hours as needed for moderate pain., Disp: 30 tablet, Rfl: 0 .  vortioxetine HBr (TRINTELLIX) 10 MG TABS tablet, Take 10 mg by mouth daily., Disp: , Rfl:   EXAM:  VITALS per patient if applicable:  RR between 12-20 bpm  GENERAL: alert, oriented, appears well and in no acute distress  HEENT: atraumatic, conjunctiva clear, no obvious abnormalities on inspection of external nose and ears  NECK: normal movements of the head and neck  LUNGS: on inspection no signs of respiratory distress, breathing rate appears normal, no obvious gross SOB, gasping or wheezing  CV: no  obvious cyanosis  MS: moves all visible extremities without noticeable abnormality  PSYCH/NEURO: pleasant and cooperative, no obvious depression or anxiety, speech and thought processing grossly intact  ASSESSMENT AND PLAN:  Discussed the following assessment and plan:  Anxiety  -improving -continue Cymbalta 60 mg daily.  Offered to send in rx for 60 mg tabs instead of 30 mg.  Left rx as take two 30 mg tabs as unsure if cost would be the same. - Plan: DULoxetine (CYMBALTA) 30 MG capsule  F/u prn in the next  3-4 months   I discussed the assessment and treatment plan with the patient. The patient was provided an opportunity to ask questions and all were answered. The patient agreed with the plan and demonstrated an understanding of the instructions.   The patient was advised to call back or seek an in-person evaluation if the symptoms worsen or if the condition fails to improve as anticipated.  I provided 11 minutes of non-face-to-face time during this encounter.   Billie Ruddy, MD

## 2020-09-04 ENCOUNTER — Telehealth: Payer: Self-pay | Admitting: Family Medicine

## 2020-09-04 ENCOUNTER — Encounter: Payer: Self-pay | Admitting: Family Medicine

## 2020-09-04 ENCOUNTER — Telehealth (INDEPENDENT_AMBULATORY_CARE_PROVIDER_SITE_OTHER): Payer: 59 | Admitting: Family Medicine

## 2020-09-04 DIAGNOSIS — M5442 Lumbago with sciatica, left side: Secondary | ICD-10-CM | POA: Diagnosis not present

## 2020-09-04 DIAGNOSIS — F320 Major depressive disorder, single episode, mild: Secondary | ICD-10-CM | POA: Diagnosis not present

## 2020-09-04 DIAGNOSIS — G4700 Insomnia, unspecified: Secondary | ICD-10-CM

## 2020-09-04 DIAGNOSIS — M5441 Lumbago with sciatica, right side: Secondary | ICD-10-CM

## 2020-09-04 DIAGNOSIS — R5383 Other fatigue: Secondary | ICD-10-CM

## 2020-09-04 DIAGNOSIS — G8929 Other chronic pain: Secondary | ICD-10-CM

## 2020-09-04 MED ORDER — LIOTHYRONINE SODIUM 25 MCG PO TABS: 25.0000 ug | ORAL_TABLET | Freq: Every day | ORAL | 3 refills | Status: DC

## 2020-09-04 NOTE — Progress Notes (Signed)
Virtual Visit via Video Note  I connected with Melody Sanchez on 09/04/20 at  4:30 PM EDT by a video enabled telemedicine application 2/2 [id number]-06 pandemic and verified that I am speaking with the correct person using two identifiers.  Location patient: home Location provider:work or home office Persons participating in the virtual visit: patient, provider  I discussed the limitations of evaluation and management by telemedicine and the availability of in person appointments. The patient expressed understanding and agreed to proceed.   HPI: Pt's father has colon cancer with lung mets.  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  Pt endorses feeling depressed.  Taking cymbalta 60 mg daily, but feels like it is not working as well as it did in the past.  Having difficulty concentrating on training for new job.  Trying to take walks.  Having difficulty staying asleep.  Will toss an turn throughout the night.  At times gets up and looks at the [place].  Feels tired the next day.  Pt also with chronic low back pain, intermittent sciatica.  Pt would like to go back to pain management.    ROS: See pertinent positives and negatives per HPI.  Past Medical History:  Diagnosis Date   Anxiety    Arthritis    Neck, Hip   Iron deficiency anemia    Migraines    Sinus tachycardia    Uterine fibroid     Past Surgical History:  Procedure Laterality Date   CESAREAN SECTION     x3   LAMINECTOMY  10/2017   ROBOTIC ASSISTED LAPAROSCOPIC HYSTERECTOMY AND SALPINGECTOMY Bilateral 02/17/2019   Procedure: XI ROBOTIC ASSISTED LAPAROSCOPIC HYSTERECTOMY WITH BILATERAL  SALPINGECTOMY;  Surgeon: Christophe Louis, MD;  Location: [facility];  Service: Gynecology;  Laterality: Bilateral;   TUBAL LIGATION      History reviewed. No pertinent family history.    Current Outpatient Medications:    acetaminophen (TYLENOL) 325 MG tablet, Take 2 tablets (650 mg total) by mouth every 4 (four) hours as needed for mild  pain (temperature > 101.5.)., Disp:  , Rfl:    cyclobenzaprine (FLEXERIL) 10 MG tablet, Take 10 mg by mouth 3 (three) times daily as needed for muscle spasms., Disp: , Rfl:    DULoxetine (CYMBALTA) 30 MG capsule, Take 2 capsules (60 mg total) by mouth daily., Disp: 180 capsule, Rfl: 3   gabapentin (NEURONTIN) 300 MG capsule, Take 300 mg by mouth 3 (three) times daily., Disp: , Rfl:    hydroxypropyl methylcellulose / hypromellose (ISOPTO TEARS / GONIOVISC) 2.5 % ophthalmic solution, Place 1 drop into both eyes 3 (three) times daily as needed for dry eyes., Disp: , Rfl:    liothyronine (CYTOMEL) 25 MCG tablet, Take 1 tablet (25 mcg total) by mouth daily., Disp: 30 tablet, Rfl: 3   Multiple Vitamins-Minerals (MULTIVITAMIN WITH MINERALS) tablet, Take 1 tablet by mouth daily., Disp: , Rfl:    vortioxetine HBr (TRINTELLIX) 10 MG TABS tablet, Take 10 mg by mouth daily., Disp: , Rfl:   EXAM:  VITALS per patient if applicable: RR between 30-16 bpm  GENERAL: alert, oriented, appears well and in no acute distress  HEENT: atraumatic, conjunctiva clear, no obvious abnormalities on inspection of external nose and ears  NECK: normal movements of the head and neck  LUNGS: on inspection no signs of respiratory distress, breathing rate appears normal, no obvious gross SOB, gasping or wheezing  CV: no obvious cyanosis  MS: moves all visible extremities without noticeable abnormality  PSYCH/NEURO: pleasant and  cooperative, no obvious depression or anxiety, speech and thought processing grossly intact  ASSESSMENT AND PLAN:  Discussed the following assessment and plan:  Chronic bilateral low back pain with bilateral sciatica  -Discussed supportive care including stretching, heat, topical analgesics, etc. - Plan: Ambulatory referral to Pain Clinic  Depression, major, single episode, mild (HCC) -PHQ-9 score 12 -GAD-7 score 7 -Counseling encouraged -Discussed augmenting current medication with  T3 -Continue current medications - Plan: TSH, CBC with Differential/Platelet, Comprehensive metabolic panel, liothyronine (CYTOMEL) 25 MCG tablet  Insomnia, unspecified type  -Discussed sleep hygiene -Consider medication options after obtaining labs - Plan: TSH  Fatigue, unspecified type -Discussed possible causes including thyroid dysfunction, vitamin deficiencies, insomnia, anxiety/stress, depression, chronic medical conditions such as back pain. -Discussed self-care -We will obtain labs to rule out reversible causes of fatigue - Plan: TSH, Vitamin D, 25-hydroxy, Vitamin B12, CBC with Differential/Platelet, Comprehensive metabolic panel  Follow-up as needed  I discussed the assessment and treatment plan with the patient. The patient was provided an opportunity to ask questions and all were answered. The patient agreed with the plan and demonstrated an understanding of the instructions.   The patient was advised to call back or seek an in-person evaluation if the symptoms worsen or if the condition fails to improve as anticipated.  Billie Ruddy, MD

## 2020-09-04 NOTE — Telephone Encounter (Signed)
error 

## 2020-09-05 ENCOUNTER — Telehealth: Payer: Self-pay | Admitting: Family Medicine

## 2020-09-05 NOTE — Telephone Encounter (Signed)
Patient husband is calling and stated that the medication was sent to the wrong pharmacy and if it can be resent to [facility] in [place], please advise. CB is [phone]

## 2020-09-06 ENCOUNTER — Other Ambulatory Visit: Payer: Self-pay

## 2020-09-06 DIAGNOSIS — F320 Major depressive disorder, single episode, mild: Secondary | ICD-10-CM

## 2020-09-06 MED ORDER — LIOTHYRONINE SODIUM 25 MCG PO TABS: 25.0000 ug | ORAL_TABLET | Freq: Every day | ORAL | 3 refills | Status: DC

## 2020-09-06 NOTE — Telephone Encounter (Signed)
Refill was sent to incorrect pharmacy. Pharmacy has been updated.

## 2020-09-06 NOTE — Telephone Encounter (Signed)
Spoke with Melody Sanchez, updated pharmacy and advised refill was sent.

## 2020-11-01 ENCOUNTER — Encounter: Payer: Self-pay | Admitting: Physical Medicine and Rehabilitation

## 2020-11-08 ENCOUNTER — Other Ambulatory Visit: Payer: Self-pay

## 2020-11-09 ENCOUNTER — Encounter: Payer: Self-pay | Admitting: Family Medicine

## 2020-11-09 ENCOUNTER — Ambulatory Visit (INDEPENDENT_AMBULATORY_CARE_PROVIDER_SITE_OTHER): Payer: 59 | Admitting: Family Medicine

## 2020-11-09 VITALS — BP 124/88 | HR 115 | Temp 97.9°F | Ht 61.75 in | Wt 171.0 lb

## 2020-11-09 DIAGNOSIS — M545 Low back pain, unspecified: Secondary | ICD-10-CM

## 2020-11-09 DIAGNOSIS — H6123 Impacted cerumen, bilateral: Secondary | ICD-10-CM | POA: Diagnosis not present

## 2020-11-09 DIAGNOSIS — E782 Mixed hyperlipidemia: Secondary | ICD-10-CM

## 2020-11-09 DIAGNOSIS — R5383 Other fatigue: Secondary | ICD-10-CM

## 2020-11-09 DIAGNOSIS — M542 Cervicalgia: Secondary | ICD-10-CM

## 2020-11-09 DIAGNOSIS — G8929 Other chronic pain: Secondary | ICD-10-CM | POA: Diagnosis not present

## 2020-11-09 DIAGNOSIS — Z0000 Encounter for general adult medical examination without abnormal findings: Secondary | ICD-10-CM

## 2020-11-09 LAB — LIPID PANEL
Cholesterol: 234 mg/dL — ABNORMAL HIGH (ref 0–200)
HDL: 70.1 mg/dL (ref >39.00)
LDL Cholesterol: 145 mg/dL — ABNORMAL HIGH (ref 0–99)
NonHDL: 164.28
Total CHOL/HDL Ratio: 3
Triglycerides: 97 mg/dL (ref 0.0–149.0)
VLDL: 19.4 mg/dL (ref 0.0–40.0)

## 2020-11-09 LAB — COMPREHENSIVE METABOLIC PANEL
ALT: 23 U/L (ref 0–35)
AST: 21 U/L (ref 0–37)
Albumin: 4.4 g/dL (ref 3.5–5.2)
Alkaline Phosphatase: 72 U/L (ref 39–117)
BUN: 12 mg/dL (ref 6–23)
CO2: 27 mEq/L (ref 19–32)
Calcium: 9.5 mg/dL (ref 8.4–10.5)
Chloride: 103 mEq/L (ref 96–112)
Creatinine, Ser: 0.7 mg/dL (ref 0.40–1.20)
GFR: 99.52 mL/min (ref >60.00)
Glucose, Bld: 85 mg/dL (ref 70–99)
Potassium: 4.4 mEq/L (ref 3.5–5.1)
Sodium: 140 mEq/L (ref 135–145)
Total Bilirubin: 0.7 mg/dL (ref 0.2–1.2)
Total Protein: 7.2 g/dL (ref 6.0–8.3)

## 2020-11-09 LAB — CBC WITH DIFFERENTIAL/PLATELET
Basophils Absolute: 0.1 10*3/uL (ref 0.0–0.1)
Basophils Relative: 1.2 % (ref 0.0–3.0)
Eosinophils Absolute: 0.2 10*3/uL (ref 0.0–0.7)
Eosinophils Relative: 3.8 % (ref 0.0–5.0)
HCT: 39.2 % (ref 36.0–46.0)
Hemoglobin: 13.3 g/dL (ref 12.0–15.0)
Lymphocytes Relative: 38.8 % (ref 12.0–46.0)
Lymphs Abs: 1.8 10*3/uL (ref 0.7–4.0)
MCHC: 33.8 g/dL (ref 30.0–36.0)
MCV: 90.8 fl (ref 78.0–100.0)
Monocytes Absolute: 0.5 10*3/uL (ref 0.1–1.0)
Monocytes Relative: 10.1 % (ref 3.0–12.0)
Neutro Abs: 2.1 10*3/uL (ref 1.4–7.7)
Neutrophils Relative %: 46.1 % (ref 43.0–77.0)
Platelets: 353 10*3/uL (ref 150.0–400.0)
RBC: 4.32 Mil/uL (ref 3.87–5.11)
RDW: 12.5 % (ref 11.5–15.5)
WBC: 4.6 10*3/uL (ref 4.0–10.5)

## 2020-11-09 LAB — T4, FREE: Free T4: 0.81 ng/dL (ref 0.60–1.60)

## 2020-11-09 LAB — TSH: TSH: 1.1 u[IU]/mL (ref 0.35–5.50)

## 2020-11-09 LAB — HEMOGLOBIN A1C: Hgb A1c MFr Bld: 5.5 % (ref 4.6–6.5)

## 2020-11-09 NOTE — Progress Notes (Signed)
Subjective:     Melody Sanchez is a 50 y.o. female and is here for a comprehensive physical exam.  Pt's father died on 2022-07-31.  Pt with continued neck and low back pain L sided >R.  Would like a referral to pain management at [facility] clinic with Dr. Royce Macadamia as her husband is seen there.  Previous referral placed, but pt advised could not be seen until Oct.  Pt to schedule mammogram. H/o R breast mass s/p bx 08/18/19. Thinks may be due for repeat colonoscopy given history of colon cancer in her father.  Last colonoscopy 01/16/2015.  S/p hysterectomy, followed by OB/Gyn.  Social History   Socioeconomic History   Marital status: Married    Spouse name: Not on file   Number of children: Not on file   Years of education: Not on file   Highest education level: Not on file  Occupational History   Not on file  Tobacco Use   Smoking status: Never   Smokeless tobacco: Never  Vaping Use   Vaping Use: Never used  Substance and Sexual Activity   Alcohol use: Yes    Comment: ocassionally    Drug use: No   Sexual activity: Not on file  Other Topics Concern   Not on file  Social History Narrative   Not on file   Social Determinants of Health   Financial Resource Strain: Not on file  Food Insecurity: Not on file  Transportation Needs: Not on file  Physical Activity: Not on file  Stress: Not on file  Social Connections: Not on file  Intimate Partner Violence: Not on file   Health Maintenance  Topic Date Due   HIV Screening  Never done   Hepatitis C Screening  Never done   COLONOSCOPY (Pts 45-50yrs Insurance coverage will need to be confirmed)  Never done   Zoster Vaccines- Shingrix (1 of 2) Never done   COVID-19 Vaccine (3 - Booster for Pfizer series) 01/21/2020   PAP SMEAR-Modifier  11/09/2020 (Originally 11/05/2019)   INFLUENZA VACCINE  12/04/2020   MAMMOGRAM  02/15/2021   TETANUS/TDAP  03/03/2029   Pneumococcal Vaccine 67-39 Years old  Aged Out   HPV VACCINES  Aged Out   Family  hx: Father: h/o colon cancer.  The following portions of the patient's history were reviewed and updated as appropriate: allergies, current medications, past family history, past medical history, past social history, past surgical history, and problem list.  Review of Systems Pertinent items noted in HPI and remainder of comprehensive ROS otherwise negative.   Objective:    BP 124/88 (BP Location: Left Arm, Patient Position: Sitting, Cuff Size: Normal)   Pulse (!) 115   Temp 97.9 F (36.6 C) (Oral)   Ht 5' 1.75" (1.568 m)   Wt 171 lb (77.6 kg)   LMP 01/30/2019   SpO2 95%   BMI 31.53 kg/m  General appearance: alert, cooperative, and no distress Head: Normocephalic, without obvious abnormality, atraumatic Eyes: conjunctivae/corneas clear. PERRL, EOM's intact. Fundi benign. Ears: Cerumen impaction b/l.  Normal TM's after irrigation.  Normal external ear canals both ears Nose: Nares normal. Septum midline. Mucosa normal. No drainage or sinus tenderness. Throat: lips, mucosa, and tongue normal; teeth and gums normal Neck: no adenopathy, no carotid bruit, no JVD, supple, symmetrical, trachea midline, and thyroid not enlarged, symmetric, no tenderness/mass/nodules Back: symmetric, no curvature. ROM normal. No CVA tenderness. Lungs: clear to auscultation bilaterally Heart: regular rate and rhythm, S1, S2 normal, no murmur, click, rub or  gallop Abdomen: soft, non-tender; bowel sounds normal; no masses,  no organomegaly Extremities: extremities normal, atraumatic, no cyanosis or edema Pulses: 2+ and symmetric Skin: Skin color, texture, turgor normal. No rashes or lesions Lymph nodes: Cervical, supraclavicular, and axillary nodes normal. Neurologic: Alert and oriented X 3, normal strength and tone. Normal symmetric reflexes. Normal coordination and gait    Assessment:    Healthy female exam.      Plan:    Anticipatory guidance given including wearing seatbelts, smoke detectors in the  home, increasing physical activity, increasing p.o. intake of water and vegetables.  -will obtain labs -mammogram to be scheduled -last colonoscopy 01/16/2015.  Unable to find records regarding recall in 5 years versus 10.  Patient encouraged to contact gastroenterology -Status post hysterectomy.  Continue follow-up with OB/GYN. -Given handout -Next CPE in 1 year See After Visit Summary for Counseling Recommendations   Chronic bilateral low back pain without sciatica -Discussed considering water aerobics, acupuncture, other alternative medication options -Continue current medications - Plan: CBC with Differential/Platelet, Ambulatory referral to Pain Clinic  Chronic cervical pain  - Plan: CBC with Differential/Platelet, Ambulatory referral to Pain Clinic  Bilateral impacted cerumen -Consent obtained.  Bilateral ears irrigated.  Patient tolerated procedure well. -Given handout  Mixed hyperlipidemia  -Lifestyle modifications - Plan: Lipid panel  Fatigue, unspecified type  -We will obtain labs previously ordered during ED visit. - Plan: Vitamin B12, Vitamin D, 25-hydroxy  Follow-up as needed  Grier Mitts, MD

## 2020-11-14 ENCOUNTER — Other Ambulatory Visit: Payer: Self-pay | Admitting: Family Medicine

## 2020-11-14 DIAGNOSIS — F419 Anxiety disorder, unspecified: Secondary | ICD-10-CM

## 2020-12-01 ENCOUNTER — Telehealth: Payer: Self-pay | Admitting: Family Medicine

## 2020-12-01 NOTE — Telephone Encounter (Signed)
Pt is calling in stating that she is needing a PA for the following Rx duloxetine l(CYMBALTA) 30 MG stated that she call the pharmacy and the stated that they are needing that before they can fill the Rx.

## 2020-12-04 NOTE — Telephone Encounter (Signed)
PA started [facility].

## 2020-12-15 NOTE — Telephone Encounter (Signed)
PA approved 8/1.

## 2021-01-06 DIAGNOSIS — Z780 Asymptomatic menopausal state: Secondary | ICD-10-CM | POA: Insufficient documentation

## 2021-02-08 ENCOUNTER — Telehealth: Payer: Self-pay | Admitting: Family Medicine

## 2021-02-08 NOTE — Telephone Encounter (Signed)
Patient would like to get shingles vaccine during her flu appointment on 10/18. This will be patient's first dose. Patient would like a call back on if the vaccine can be added or not. It is ok to leave a voicemail stating decision if patient doesn't answer.    Good callback number is [phone]     Please Advise

## 2021-02-14 NOTE — Telephone Encounter (Signed)
Spoke with pt, informed her she could get her shingles vaccine the same day as her flu. Informed her she may have to have an appointment as nurse visit as the flu vaccine will be done in flu clinic, scheduled appointment around the same time. Pt verbalized understanding.

## 2021-02-20 ENCOUNTER — Other Ambulatory Visit: Payer: Self-pay

## 2021-02-20 ENCOUNTER — Ambulatory Visit (INDEPENDENT_AMBULATORY_CARE_PROVIDER_SITE_OTHER): Payer: 59

## 2021-02-20 ENCOUNTER — Ambulatory Visit: Payer: 59

## 2021-02-20 DIAGNOSIS — Z23 Encounter for immunization: Secondary | ICD-10-CM

## 2021-02-28 ENCOUNTER — Ambulatory Visit: Payer: 59 | Admitting: Physical Medicine and Rehabilitation

## 2021-03-02 ENCOUNTER — Ambulatory Visit: Payer: 59 | Admitting: Physical Medicine and Rehabilitation

## 2021-03-06 ENCOUNTER — Other Ambulatory Visit: Payer: Self-pay | Admitting: Family Medicine

## 2021-03-06 DIAGNOSIS — Z1231 Encounter for screening mammogram for malignant neoplasm of breast: Secondary | ICD-10-CM

## 2021-03-08 ENCOUNTER — Ambulatory Visit: Payer: 59 | Admitting: Family Medicine

## 2021-03-14 ENCOUNTER — Ambulatory Visit: Payer: 59 | Admitting: Family Medicine

## 2021-04-11 ENCOUNTER — Ambulatory Visit
Admission: RE | Admit: 2021-04-11 | Discharge: 2021-04-11 | Disposition: A | Discharge status: Home / Self Care | Payer: 59 | Source: Ambulatory Visit | Attending: Family Medicine | Admitting: Family Medicine

## 2021-04-11 DIAGNOSIS — Z1231 Encounter for screening mammogram for malignant neoplasm of breast: Secondary | ICD-10-CM

## 2021-05-16 ENCOUNTER — Ambulatory Visit (INDEPENDENT_AMBULATORY_CARE_PROVIDER_SITE_OTHER): Payer: 59

## 2021-05-16 DIAGNOSIS — Z23 Encounter for immunization: Secondary | ICD-10-CM | POA: Diagnosis not present

## 2021-07-17 ENCOUNTER — Other Ambulatory Visit: Payer: Self-pay | Admitting: Physician Assistant

## 2021-07-17 DIAGNOSIS — G8929 Other chronic pain: Secondary | ICD-10-CM

## 2021-07-17 DIAGNOSIS — Z9889 Other specified postprocedural states: Secondary | ICD-10-CM

## 2021-07-17 DIAGNOSIS — M4310 Spondylolisthesis, site unspecified: Secondary | ICD-10-CM

## 2021-07-26 ENCOUNTER — Other Ambulatory Visit: Payer: Self-pay | Admitting: Physician Assistant

## 2021-07-26 DIAGNOSIS — G8929 Other chronic pain: Secondary | ICD-10-CM

## 2021-07-26 DIAGNOSIS — M4310 Spondylolisthesis, site unspecified: Secondary | ICD-10-CM

## 2021-07-26 DIAGNOSIS — Z9889 Other specified postprocedural states: Secondary | ICD-10-CM

## 2021-07-29 ENCOUNTER — Emergency Department (HOSPITAL_COMMUNITY)
Admission: EM | Admit: 2021-07-29 | Discharge: 2021-07-29 | Disposition: A | Discharge status: Home / Self Care | Payer: 59 | Attending: Emergency Medicine | Admitting: Emergency Medicine

## 2021-07-29 ENCOUNTER — Other Ambulatory Visit: Payer: Self-pay

## 2021-07-29 ENCOUNTER — Encounter (HOSPITAL_COMMUNITY): Payer: Self-pay | Admitting: Emergency Medicine

## 2021-07-29 DIAGNOSIS — M546 Pain in thoracic spine: Secondary | ICD-10-CM | POA: Insufficient documentation

## 2021-07-29 DIAGNOSIS — G8929 Other chronic pain: Secondary | ICD-10-CM | POA: Diagnosis not present

## 2021-07-29 DIAGNOSIS — M5441 Lumbago with sciatica, right side: Secondary | ICD-10-CM | POA: Insufficient documentation

## 2021-07-29 DIAGNOSIS — M542 Cervicalgia: Secondary | ICD-10-CM | POA: Insufficient documentation

## 2021-07-29 HISTORY — DX: Dorsalgia, unspecified: M54.9

## 2021-07-29 MED ORDER — PREDNISONE 20 MG PO TABS: 40.0000 mg | ORAL_TABLET | Freq: Every day | ORAL | 0 refills | Status: AC

## 2021-07-29 MED ORDER — LIDOCAINE 5 % EX PTCH
1.0000 | MEDICATED_PATCH | CUTANEOUS | 0 refills | Status: DC
Start: 2021-07-29 — End: 2021-10-31

## 2021-07-29 MED ORDER — KETOROLAC TROMETHAMINE 15 MG/ML IJ SOLN: 15.0000 mg | Freq: Once | INTRAMUSCULAR | Status: DC

## 2021-07-29 MED ORDER — HYDROCODONE-ACETAMINOPHEN 5-325 MG PO TABS
1.0000 | ORAL_TABLET | Freq: Once | ORAL | Status: AC
  Administered 2021-07-29: 1 via ORAL
  Filled 2021-07-29: qty 1

## 2021-07-29 MED ORDER — LIDOCAINE 5 % EX PTCH
1.0000 | MEDICATED_PATCH | CUTANEOUS | Status: DC
  Administered 2021-07-29: 1 via TRANSDERMAL
  Filled 2021-07-29: qty 1

## 2021-07-29 MED ORDER — KETOROLAC TROMETHAMINE 15 MG/ML IJ SOLN
15.0000 mg | Freq: Once | INTRAMUSCULAR | Status: AC
  Administered 2021-07-29: 15 mg via INTRAMUSCULAR
  Filled 2021-07-29: qty 1

## 2021-07-29 MED ORDER — PREDNISONE 20 MG PO TABS
40.0000 mg | ORAL_TABLET | Freq: Once | ORAL | Status: AC
  Administered 2021-07-29: 40 mg via ORAL
  Filled 2021-07-29: qty 2

## 2021-07-29 NOTE — Discharge Instructions (Signed)
Your exam today was reassuring against a serious cause of your back pain.  You received several medications in the emergency room with some improvement in your pain.  Your heart rate significantly improved after this regimen.  I have sent in prednisone, and lidocaine patch to the pharmacy for you.  Call your pain management clinic for refill of your pain medication.  You can call neurosurgery to schedule a follow-up to be reevaluated.  If you have any worsening in symptoms including weakness, difficulty with bowel or bladder control, or fever please return to the emergency room for evaluation. ?

## 2021-07-29 NOTE — ED Provider Notes (Addendum)
?[facility] ?Provider Note ? ? ?CSN: [id number] ?Arrival date & time: 07/29/21  1446 ? ?  ? ?History ? ?Chief Complaint  ?Patient presents with  ? Back Pain  ? ? ?Melody Sanchez is a 51 y.o. female. ? ?51 year old female with history of chronic low back pain presents today for evaluation of acute flareup of her back pain.  She states she had onset of this severe pain since yesterday which is making it difficult for her to walk.  She denies recent trauma, or falls.  She does follow with pain management clinic as well as neurosurgery at [facility].  She was recently evaluated by neurosurgery and had MRI performed.  She states they are obtaining a DEXA scan and considering surgery for her spine.  She states even at baseline she has severe pain but she is able to get through her activities of daily living without much issue.  He denies fever, chills, bowel or bladder incontinence, saddle anesthesia, abdominal pain, dysuria, inability to walk.  She denies personal history of cancer, unintentional weight loss, or IVDU.  She does have history of lumbar spine surgery in 2018. ? ?The history is provided by the patient. No language interpreter was used.  ? ?  ? ?Home Medications ?Prior to Admission medications   ?Medication Sig Start Date End Date Taking? Authorizing Provider  ?acetaminophen (TYLENOL) 325 MG tablet Take 2 tablets (650 mg total) by mouth every 4 (four) hours as needed for mild pain (temperature > 101.5.). 02/18/19   Christophe Louis, MD  ?cyclobenzaprine (FLEXERIL) 10 MG tablet Take 10 mg by mouth 3 (three) times daily as needed for muscle spasms.    [provider]  ?DULoxetine (CYMBALTA) 30 MG capsule TAKE 2 CAPSULES BY MOUTH EVERY DAY 11/15/20   Billie Ruddy, MD  ?gabapentin (NEURONTIN) 300 MG capsule Take 300 mg by mouth 3 (three) times daily.    [provider]  ?hydroxypropyl methylcellulose / hypromellose (ISOPTO TEARS / GONIOVISC) 2.5 % ophthalmic  solution Place 1 drop into both eyes 3 (three) times daily as needed for dry eyes. ?Patient not taking: Reported on 11/09/2020    [provider]  ?liothyronine (CYTOMEL) 25 MCG tablet Take 1 tablet (25 mcg total) by mouth daily. 09/06/20   Billie Ruddy, MD  ?Multiple Vitamins-Minerals (MULTIVITAMIN WITH MINERALS) tablet Take 1 tablet by mouth daily.    [provider]  ?vortioxetine HBr (TRINTELLIX) 10 MG TABS tablet Take 10 mg by mouth daily.    [provider]  ?   ? ?Allergies    ?Patient has no known allergies.   ? ?Review of Systems   ?Review of Systems  ?Constitutional:  Negative for chills, fever and unexpected weight change.  ?Gastrointestinal:  Negative for abdominal pain, nausea and vomiting.  ?Genitourinary:  Negative for difficulty urinating, dysuria and frequency.  ?Musculoskeletal:  Positive for back pain. Negative for gait problem.  ?Neurological:  Negative for weakness and numbness.  ?All other systems reviewed and are negative. ? ?Physical Exam ?Updated Vital Signs ?BP (!) 131/94 (BP Location: Left Arm)   Pulse (!) 121   Temp 99 ?F (37.2 ?C) (Oral)   Resp 16   LMP 01/30/2019   SpO2 96%  ?Physical Exam ?Vitals and nursing note reviewed.  ?Constitutional:   ?   General: She is not in acute distress. ?   Appearance: Normal appearance. She is not ill-appearing.  ?HENT:  ?   Head: Normocephalic and atraumatic.  ?  Nose: Nose normal.  ?Eyes:  ?   General: No scleral icterus. ?   Extraocular Movements: Extraocular movements intact.  ?   Conjunctiva/sclera: Conjunctivae normal.  ?Cardiovascular:  ?   Rate and Rhythm: Normal rate and regular rhythm.  ?   Pulses: Normal pulses.  ?   Heart sounds: Normal heart sounds.  ?Pulmonary:  ?   Effort: Pulmonary effort is normal. No respiratory distress.  ?   Breath sounds: Normal breath sounds. No wheezing or rales.  ?Abdominal:  ?   General: There is no distension.  ?   Tenderness: There is no abdominal tenderness.  ?Musculoskeletal:      ?   General: Normal range of motion.  ?   Cervical back: Normal range of motion.  ?   Comments: Cervical, thoracic, lumbar spinal process with tenderness to palpation.  Spine without step-offs.  Right lumbar paraspinal muscles with tenderness to palpation.  Full range of motion in bilateral lower extremities.  Patient is able to ambulate without difficulty.  5/5 strength in bilateral lower extremities.  2+ DP pulse present bilaterally.  Brisk cap refill.  ?Skin: ?   General: Skin is warm and dry.  ?Neurological:  ?   General: No focal deficit present.  ?   Mental Status: She is alert. Mental status is at baseline.  ? ? ?ED Results / Procedures / Treatments   ?Labs ?(all labs ordered are listed, but only abnormal results are displayed) ?Labs Reviewed - No data to display ? ?EKG ?None ? ?Radiology ?No results found. ? ?Procedures ?Procedures  ? ? ?Medications Ordered in ED ?Medications - No data to display ? ?ED Course/ Medical Decision Making/ A&P ?  ?                        ?Medical Decision Making ?Risk ?Prescription drug management. ? ? ?Medical Decision Making / ED Course ? ? ?This patient presents to the ED for concern of back pain, this involves an extensive number of treatment options, and is a complaint that carries with it a high risk of complications and morbidity.  The differential diagnosis includes cauda equina, spinal epidural abscess, muscle strain, acute on chronic back pain, ? ?MDM: ?51 year old female with past medical history of chronic low back pain presents today for evaluation of acute flareup of her back pain.  She denies any new injury.  She is without red flag signs or symptoms concerning for cauda equina syndrome, or spinal epidural abscess.  Exam is reassuring with good strength, range of motion, and ability to walk without difficulty.  Spinal process tenderness present however she reports tenderness at baseline as well.  She has a history of spinal surgery back in 2018.  We will  provide patient with Toradol, lidocaine patch, dose of Norco, and prednisone.  We will reevaluate patient. ?Patient reports improvement in pain following addition of pain regimen.  Heart rate significantly improved from 121 on arrival to 92 during my reevaluation.  Patient was resting comfortably with eyes closed.  Will prescribe patient prednisone and lidocaine patch.  Discussed importance of follow-up with neurosurgery and her pain management clinic.  Return precautions discussed.  Patient voices understanding and is in agreement with plan.  Patient is appropriate for discharge.  Discussed with nurse to update vital signs prior to discharge. ? ?Additional history obtained: ?-Additional history obtained from husband who is at bedside.  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  Per neurosurgery note they were considering surgery pending consult with another neurosurgeon within the clinic. ?-External records from outside source obtained and reviewed including: Chart review including previous notes, labs, imaging, consultation notes ? ? ?Lab Tests: ?-I ordered, reviewed, and interpreted labs.   ?The pertinent results include:   ?Labs Reviewed - No data to display  ? ? ?EKG ? EKG Interpretation ? ?Date/Time:    ?Ventricular Rate:    ?PR Interval:    ?QRS Duration:   ?QT Interval:    ?QTC Calculation:   ?R Axis:     ?Text Interpretation:   ?  ? ?  ? ? ?Medicines ordered and prescription drug management: ?Meds ordered this encounter  ?Medications  ? lidocaine (LIDODERM) 5 % 1 patch  ? DISCONTD: ketorolac (TORADOL) 15 MG/ML injection 15 mg  ? HYDROcodone-acetaminophen (NORCO/VICODIN) 5-325 MG per tablet 1 tablet  ? predniSONE (DELTASONE) tablet 40 mg  ? ketorolac (TORADOL) 15 MG/ML injection 15 mg  ?  ?-I have reviewed the patients home medicines and have made adjustments as  needed ? ?Reevaluation: ?After the interventions noted above, I reevaluated the patient and found that they have :improved ? ?Co morbidities that complicate the patient evaluation ? ?Past Medical History:  ?Diagnosis Date  ? Anxiety

## 2021-07-29 NOTE — ED Triage Notes (Signed)
Pt has been doing physical therapy for back pain (sent by neurosurgeon) x 1 1/2 weeks.  Last went on Friday.  Reports increased lower back pain with radiation down R leg since Friday.  States she occasionally has R leg pain but it is more severe since Friday.  Goes to [facility] for pain management.  No incontinence. ?

## 2021-08-07 ENCOUNTER — Ambulatory Visit
Admission: RE | Admit: 2021-08-07 | Discharge: 2021-08-07 | Disposition: A | Discharge status: Home / Self Care | Payer: 59 | Source: Ambulatory Visit | Attending: Physician Assistant | Admitting: Physician Assistant

## 2021-08-07 DIAGNOSIS — G8929 Other chronic pain: Secondary | ICD-10-CM | POA: Diagnosis present

## 2021-08-07 DIAGNOSIS — M5441 Lumbago with sciatica, right side: Secondary | ICD-10-CM | POA: Diagnosis present

## 2021-08-07 DIAGNOSIS — Z9889 Other specified postprocedural states: Secondary | ICD-10-CM | POA: Diagnosis present

## 2021-08-07 DIAGNOSIS — M4310 Spondylolisthesis, site unspecified: Secondary | ICD-10-CM | POA: Insufficient documentation

## 2021-08-20 ENCOUNTER — Encounter: Payer: Self-pay | Admitting: Emergency Medicine

## 2021-08-20 ENCOUNTER — Emergency Department
Admission: EM | Admit: 2021-08-20 | Discharge: 2021-08-20 | Disposition: A | Discharge status: Home / Self Care | Payer: 59 | Attending: Emergency Medicine | Admitting: Emergency Medicine

## 2021-08-20 ENCOUNTER — Other Ambulatory Visit: Payer: Self-pay

## 2021-08-20 DIAGNOSIS — M5431 Sciatica, right side: Secondary | ICD-10-CM

## 2021-08-20 DIAGNOSIS — G8929 Other chronic pain: Secondary | ICD-10-CM | POA: Insufficient documentation

## 2021-08-20 DIAGNOSIS — M5441 Lumbago with sciatica, right side: Secondary | ICD-10-CM | POA: Diagnosis present

## 2021-08-20 MED ORDER — NAPROXEN 500 MG PO TABS
500.0000 mg | ORAL_TABLET | Freq: Once | ORAL | Status: AC
  Administered 2021-08-20: 500 mg via ORAL
  Filled 2021-08-20: qty 1

## 2021-08-20 MED ORDER — NAPROXEN 500 MG PO TABS: 500.0000 mg | ORAL_TABLET | Freq: Two times a day (BID) | ORAL | 2 refills | Status: DC

## 2021-08-20 MED ORDER — HYDROCODONE-ACETAMINOPHEN 5-325 MG PO TABS
1.0000 | ORAL_TABLET | Freq: Once | ORAL | Status: AC
  Administered 2021-08-20: 1 via ORAL
  Filled 2021-08-20: qty 1

## 2021-08-20 MED ORDER — METHOCARBAMOL 500 MG PO TABS: 500.0000 mg | ORAL_TABLET | Freq: Three times a day (TID) | ORAL | 0 refills | Status: DC | PRN

## 2021-08-20 MED ORDER — DEXAMETHASONE SODIUM PHOSPHATE 10 MG/ML IJ SOLN
10.0000 mg | Freq: Once | INTRAMUSCULAR | Status: AC
  Administered 2021-08-20: 10 mg via INTRAMUSCULAR
  Filled 2021-08-20: qty 1

## 2021-08-20 NOTE — ED Provider Notes (Signed)
? ?[facility] ?Provider Note ? ? ? Event Date/Time  ? First MD Initiated Contact with Patient 08/20/21 1129   ?  (approximate) ? ? ?History  ? ?Back Pain ? ? ?HPI ? ?Melody Sanchez is a 51 y.o. female with history of back pain, migraines, iron deficiency anemia who presents with complaints of right-sided low back pain.  Patient reports she has had right-sided low back pain with sciatica for quite some time, has a follow-up with neurosurgery on the 24th of this month.  Patient reports her chronic pain has worsened over the last several days with radiation into her right leg as per usual.  No weakness or numbness.  No loss of bowel or bladder continence.  No fevers ?  ? ? ?Physical Exam  ? ?Triage Vital Signs: ?ED Triage Vitals  ?Enc Vitals Group  ?   BP 08/20/21 1125 122/89  ?   Pulse Rate 08/20/21 1125 (!) 105  ?   Resp 08/20/21 1125 18  ?   Temp 08/20/21 1125 98.4 ?F (36.9 ?C)  ?   Temp Source 08/20/21 1125 Oral  ?   SpO2 08/20/21 1125 100 %  ?   Weight 08/20/21 1124 72.6 kg (160 lb)  ?   Height 08/20/21 1124 1.575 m ('5\' 2"'$ )  ?   Head Circumference --   ?   Peak Flow --   ?   Pain Score 08/20/21 1124 9  ?   Pain Loc --   ?   Pain Edu? --   ?   Excl. in [place]? --   ? ? ?Most recent vital signs: ?Vitals:  ? 08/20/21 1125  ?BP: 122/89  ?Pulse: (!) 105  ?Resp: 18  ?Temp: 98.4 ?F (36.9 ?C)  ?SpO2: 100%  ? ? ? ?General: Awake, no distress.  ?CV:  Good peripheral perfusion.  ?Resp:  Normal effort.  ?Abd:  No distention.  ?Other:  Normal strength in the lower extremities, warm and well-perfused bilaterally ?Back: No vertebral tenderness palpation, mild right lower lumbar paraspinal tenderness to palpation ? ? ?ED Results / Procedures / Treatments  ? ?Labs ?(all labs ordered are listed, but only abnormal results are displayed) ?Labs Reviewed - No data to display ? ? ?EKG ? ? ? ? ?RADIOLOGY ? ? ? ? ?PROCEDURES: ? ?Critical Care performed:  ?Procedures ? ? ?MEDICATIONS ORDERED IN ED: ?Medications   ?dexamethasone (DECADRON) injection 10 mg (10 mg Intramuscular Given 08/20/21 1203)  ?naproxen (NAPROSYN) tablet 500 mg (500 mg Oral Given 08/20/21 1202)  ?HYDROcodone-acetaminophen (NORCO/VICODIN) 5-325 MG per tablet 1 tablet (1 tablet Oral Given 08/20/21 1202)  ? ? ? ?IMPRESSION / MDM / ASSESSMENT AND PLAN / ED COURSE  ?I reviewed the triage vital signs and the nursing notes. ? ? ? ?Patient with chronic low back pain with right-sided sciatica, appears to be having a significant flare currently.  Neurologically intact, has outpatient follow-up arranged.  Will treat with IM Decadron, p.o. naproxen, p.o. Vicodin and reevaluate. ? ?Patient significantly improved after treatment, she has outpatient follow-up arranged, appropriate for discharge, prescriptions provided ? ?  ? ? ?FINAL CLINICAL IMPRESSION(S) / ED DIAGNOSES  ? ?Final diagnoses:  ?Sciatica of right side  ? ? ? ?Rx / DC Orders  ? ?ED Discharge Orders   ? ?      Ordered  ?  methocarbamol (ROBAXIN) 500 MG tablet  Every 8 hours PRN       ? 08/20/21 1241  ?  naproxen (NAPROSYN)  500 MG tablet  2 times daily with meals       ? 08/20/21 1241  ? ?  ?  ? ?  ? ? ? ?Note:  This document was prepared using Dragon voice recognition software and may include unintentional dictation errors. ?  ?Lavonia Drafts, MD ?08/20/21 1550 ? ?

## 2021-08-20 NOTE — ED Notes (Signed)
See triage note presents with right lower back pain which is moving into right leg   states she has a hx of back issues and has had back surgeries in past  ambulates slowly d/t increased pain ?

## 2021-08-20 NOTE — ED Triage Notes (Signed)
Pt via POV from home. Pt lower back pain that radiates down her R leg, pt is suppose to see a neurosurgeon on 4/24 but states that the pain has gotten worse over the weekend. Pt is A&OX4 and NAD ?

## 2021-08-29 ENCOUNTER — Ambulatory Visit (INDEPENDENT_AMBULATORY_CARE_PROVIDER_SITE_OTHER): Payer: 59 | Admitting: Family Medicine

## 2021-08-29 ENCOUNTER — Encounter: Payer: Self-pay | Admitting: Family Medicine

## 2021-08-29 VITALS — BP 135/91 | HR 108 | Temp 98.4°F | Wt 164.0 lb

## 2021-08-29 DIAGNOSIS — G8929 Other chronic pain: Secondary | ICD-10-CM

## 2021-08-29 DIAGNOSIS — M542 Cervicalgia: Secondary | ICD-10-CM

## 2021-08-29 DIAGNOSIS — R202 Paresthesia of skin: Secondary | ICD-10-CM | POA: Diagnosis not present

## 2021-08-29 DIAGNOSIS — M5441 Lumbago with sciatica, right side: Secondary | ICD-10-CM

## 2021-08-29 DIAGNOSIS — M4316 Spondylolisthesis, lumbar region: Secondary | ICD-10-CM | POA: Diagnosis not present

## 2021-08-29 NOTE — Progress Notes (Signed)
Subjective:  ? ? Patient ID: Melody Sanchez, female    DOB: [birth year], 51 y.o.   MRN: [id number] ? ?Chief Complaint  ?Patient presents with  ? Back Pain  ?  Pain, stiffness x 5 months. Prednisone helps with sciatica more than anything else. Has started PT.   ?Pt accompanied by her husband. ? ?HPI ?Patient was seen today for f/u on chronic concern and possible FMLA.  Pt states she is continuing to have low back pain with intermittent sciatica RLE>LLE and midline cervical neck pain.  Pt is followed by Neurosurgery, [facility], and PT.  Went to ED 08/20/21 for increased pain, given IM decadron.  Had appt with Neurosurgery 4/24.  Per pt she has a f/u in June in regards to possible nerve stimulator.  Pt states she does not want to have surgery.  Per chart review it appears surgery not indicated at this time by Neurosurgery.  Pt sits on exercise ball at work and uses standing desk.  Has been in PT x2 months.  On Nucynta.  On a good day pain is 5/10 but can be 8-9/10 on a bad day.  Patient endorses burning in her neck at times going to the top of right shoulder and down back to bra line.  Pain in the lower back goes from center of back down lateral R thigh to right ankle.  At times has a tingling/needle sensation in RLE.  Denies loss of bowel or bladder.  Has intermittent weakness in LE which has caused 2 falls, one over the wknd and another in late 2022.  Pt also tried heat, ice, stretching.  Pt states she would like FMLA for appts and days that the pain is increased.  Pain management does not do FMLA.  Pt has not spoken with Neurosurg about forms.   Pt would like to be able to play with her grandkids without pain. ? ?Past Medical History:  ?Diagnosis Date  ? Anxiety   ? Arthritis   ? Neck, Hip  ? Back pain   ? Iron deficiency anemia   ? Migraines   ? Sinus tachycardia   ? Uterine fibroid   ? ? ?No Known Allergies ? ?ROS ?General: Denies fever, chills, night sweats, changes in weight, changes in  appetite ?HEENT: Denies headaches, ear pain, changes in vision, rhinorrhea, sore throat ?CV: Denies CP, palpitations, SOB, orthopnea ?Pulm: Denies SOB, cough, wheezing ?GI: Denies abdominal pain, nausea, vomiting, diarrhea, constipation ?GU: Denies dysuria, hematuria, frequency, vaginal discharge ?Msk: Denies muscle cramps, joint pains +low back pain with sciatica, midline neck pain ?Neuro:  +weakness, numbness, tingling in LEs ?Skin: Denies rashes, bruising ?Psych: Denies depression, anxiety, hallucinations ?   ?Objective:  ?  ?Blood pressure (!) 135/91, pulse (!) 108, temperature 98.4 ?F (36.9 ?C), temperature source Oral, weight 164 lb (74.4 kg), last menstrual period 01/30/2019, SpO2 100 %. ? ?Gen. Pleasant, well-nourished, in no distress, normal affect  ?HEENT: [place]/AT, face symmetric, conjunctiva clear, no scleral icterus, PERRLA, EOMI, nares patent without drainage ?Lungs: no accessory muscle use, CTAB, no wheezes or rales ?Cardiovascular: RRR, no m/r/g, no peripheral edema ?Musculoskeletal: TTP on lower cervical spine midline.  No TTP of lumbar spine, thoracic spine, or paraspinal muscles.  Grip strength 5/5 on R and 4/5 on L.  Slightly decreased strength in LLE and normal in RLE.  No deformities, no cyanosis or clubbing, normal tone ?Neuro:  A&Ox3, CN II-XII intact, normal gait.  Reflexes attempted but difficult to elicit 2/2 pt's position  in chair. ?Skin:  Warm, no lesions/ rash ? ? ?Wt Readings from Last 3 Encounters:  ?08/29/21 164 lb (74.4 kg)  ?08/20/21 160 lb (72.6 kg)  ?11/09/20 171 lb (77.6 kg)  ? ? ?Lab Results  ?Component Value Date  ? WBC 4.6 11/09/2020  ? HGB 13.3 11/09/2020  ? HCT 39.2 11/09/2020  ? PLT 353.0 11/09/2020  ? GLUCOSE 85 11/09/2020  ? CHOL 234 (H) 11/09/2020  ? TRIG 97.0 11/09/2020  ? HDL 70.10 11/09/2020  ? LDLCALC 145 (H) 11/09/2020  ? ALT 23 11/09/2020  ? AST 21 11/09/2020  ? NA 140 11/09/2020  ? K 4.4 11/09/2020  ? CL 103 11/09/2020  ? CREATININE 0.70 11/09/2020  ? BUN 12  11/09/2020  ? CO2 27 11/09/2020  ? TSH 1.10 11/09/2020  ? HGBA1C 5.5 11/09/2020  ? ? ?Assessment/Plan: ? On day of service, 36 minutes spent caring for this patient face-to-face, reviewing the chart, counseling and/or coordinating care for plan and treatment of diagnosis below.   ? ?Chronic bilateral low back pain with right-sided sciatica ? ?Spondylolisthesis of lumbar region ? ?Chronic cervical pain ? ?Paresthesia - Plan: Vitamin B12, Folate, CBC with Differential/Platelet ? ?Discussed various treatment options for chronic pain including water aerobics and acupuncture.  Continue PT.  Also advised to consider therapy as it can be challenging dealing with chronic health problems.  Pt seem open to the idea.  Given a list of area providers to reach out to for an appt.  Advised referral generally not needed.  Will check b12 and folate given h/o paresthesias.  TSH was normal when last checked.  Per chart review: MRI C-spine with trace retrolisthesis of C4 on C5 and moderate C4-C7 degenerative disease.  L4 mild L4-L5 lateral recess stenosis and foraminal narrowing on left side per MRI.  It seems revision surgical intervention not indicated at this time based on exam and symptoms.  Patient advised to proceed with the evaluation for dorsal column stimulation in June and EMG to evaluate for any chronic changes.  Continue follow-up with neurosurgery. Continue Nucynta and follow-up with pain management.  Can have FMLA forms sent to this provider however limited information likely to be provided as she is actively followed by neurosurgery for chronic management of current back pain symptoms. ? ?F/u as needed. ? ?Grier Mitts, MD ?

## 2021-08-29 NOTE — Patient Instructions (Addendum)
Make sure you reach out to Neurosurgery too in regards to FMLA forms as they follow you more regularly for your ongoing symptoms. ? ? ?Behavioral Health Services: ?-to make an appointment contact the office/provider you are interested in seeing.  No referral is needed.  The below is not an all inclusive list, but will help you get started. ? ?[facility] ?-counseling located off of [address]. ? ?[email or web address] ?-website helps you find providers in your area ? ?[facility] ?-Located off of [address]. across from [employer] ? ?Dr. Darleene Cleaver is a Psychiatrist with [facility] [phone] ? ?[facility] ? ?[facility]  ?-[place]  [phone] ?-a place in town that has counseling and Psychiatry services.   ? ?[facility] ?Acupuncture and [place] medicine ?Reinaldo Raddle  ?[phone] ?[address] ?[place], [place] [address] ?[email or web address] ? ?

## 2021-08-30 ENCOUNTER — Encounter: Payer: Self-pay | Admitting: Family Medicine

## 2021-08-30 LAB — VITAMIN B12: Vitamin B-12: 392 pg/mL (ref 211–911)

## 2021-08-30 LAB — CBC WITH DIFFERENTIAL/PLATELET
Basophils Absolute: 0.1 10*3/uL (ref 0.0–0.1)
Basophils Relative: 1.5 % (ref 0.0–3.0)
Eosinophils Absolute: 0.2 10*3/uL (ref 0.0–0.7)
Eosinophils Relative: 2.8 % (ref 0.0–5.0)
HCT: 42.3 % (ref 36.0–46.0)
Hemoglobin: 14.2 g/dL (ref 12.0–15.0)
Lymphocytes Relative: 37 % (ref 12.0–46.0)
Lymphs Abs: 2.8 10*3/uL (ref 0.7–4.0)
MCHC: 33.7 g/dL (ref 30.0–36.0)
MCV: 92 fl (ref 78.0–100.0)
Monocytes Absolute: 0.7 10*3/uL (ref 0.1–1.0)
Monocytes Relative: 8.8 % (ref 3.0–12.0)
Neutro Abs: 3.8 10*3/uL (ref 1.4–7.7)
Neutrophils Relative %: 49.9 % (ref 43.0–77.0)
Platelets: 469 10*3/uL — ABNORMAL HIGH (ref 150.0–400.0)
RBC: 4.59 Mil/uL (ref 3.87–5.11)
RDW: 13.9 % (ref 11.5–15.5)
WBC: 7.5 10*3/uL (ref 4.0–10.5)

## 2021-08-30 LAB — FOLATE: Folate: 21.3 ng/mL (ref >5.9)

## 2021-09-06 ENCOUNTER — Encounter: Payer: Self-pay | Admitting: Family Medicine

## 2021-09-18 NOTE — Telephone Encounter (Signed)
Pt call back and stated she want to know did dr.Banks receive her FLMA forms and will reach back out again tomorrow. ?

## 2021-09-19 ENCOUNTER — Telehealth: Admitting: Physician Assistant

## 2021-09-19 DIAGNOSIS — M549 Dorsalgia, unspecified: Secondary | ICD-10-CM

## 2021-09-19 NOTE — Progress Notes (Signed)
Based on what you shared with me, I feel your condition warrants further evaluation and I recommend that you be seen in a face to face visit.  Giving chronicity of pain and radiation into legs with weakness, this warrants and in person evaluation. This is not something we can adequately assess/manage via e-visit or virtual video visit.  ? ?Please call your PCP or be seen at a local urgent care ASAP.  ?  ?NOTE: There will be NO CHARGE for this eVisit ?  ?If you are having a true medical emergency please call 911.   ?  ? For an urgent face to face visit, [facility] has six urgent care centers for your convenience:  ?  ? [facility] [place] [facility] ?Get Driving Directions ?[phone] ?[address] ?[place], [place] [address] ?  ? [facility]) ?Get Driving Directions ?[phone] ?[address] ?[place], [place] [address] ? ?[facility] [place] [facility]) ?Get Driving Directions ?[address],  [place]  [address] ? ?[facility] ?Get Driving Directions ?[phone] ?[address], [address] ?[place], [place] [address] ?  ?[facility] [place] ?Get Driving Directions  ?[phone] ?[address] ?[address] ?[place], [place] [address] ?  ?[facility] ?Get Driving Directions ?[phone] ?[address] ?[place], [place] [address] ? ?Your MyChart E-visit questionnaire answers were reviewed by a board certified advanced clinical practitioner to complete your personal care plan based on your specific symptoms.  Thank you for using e-Visits. ?  ? ?

## 2021-09-21 ENCOUNTER — Telehealth: Payer: Self-pay | Admitting: Family Medicine

## 2021-09-21 NOTE — Telephone Encounter (Signed)
Raquel Sarna (pt husband) called to check on the status of pt FMLA ppwk. He stated that they will need those forms filled out as soon as possible since pt deadline has been extended. He stated pt's job is in jeopardy if the forms are not filled out soon. Would like a call to pick up the forms when they are done.   Shawn [phone]  Please advise.

## 2021-09-24 NOTE — Telephone Encounter (Signed)
Pt Melody Sanchez is aware md out of office until wed 09-26-2021

## 2021-09-26 NOTE — Telephone Encounter (Signed)
Spoke with pt husband, Raquel Sarna, informed him that PCP has not been treating pt for reason FMLA needs to be completed. Shawn then put call on speaker phone and pt was asking what to do, as she was informed to send FMLA to PCP, advised them that looking through the notes since she became a pt with PCP, she has not treated her for the back pain. In the notes, she has been referred to pain clinic, also saw in notes pt saw neurosurgeon. Pt stated the neurosurgeon has referred her to a colleague of his and is no longer treating her. Did let the pt and husband know I understood, but the neurosurgeon has notes from seeing and offering advice, and they would be better able to complete or send information to colleague to complete or be ale to see them sooner that the appointment in late June. Pts husband verbalized understanding and stated he will be reaching out to the specialists that was seen and will be seeing.

## 2021-10-31 ENCOUNTER — Ambulatory Visit (INDEPENDENT_AMBULATORY_CARE_PROVIDER_SITE_OTHER): Payer: 59 | Admitting: Family Medicine

## 2021-10-31 ENCOUNTER — Encounter: Payer: Self-pay | Admitting: Family Medicine

## 2021-10-31 VITALS — BP 130/90 | HR 122 | Temp 98.2°F | Wt 165.2 lb

## 2021-10-31 DIAGNOSIS — R5383 Other fatigue: Secondary | ICD-10-CM

## 2021-10-31 DIAGNOSIS — L590 Erythema ab igne [dermatitis ab igne]: Secondary | ICD-10-CM | POA: Diagnosis not present

## 2021-10-31 DIAGNOSIS — Z780 Asymptomatic menopausal state: Secondary | ICD-10-CM | POA: Diagnosis not present

## 2021-10-31 DIAGNOSIS — R58 Hemorrhage, not elsewhere classified: Secondary | ICD-10-CM

## 2021-10-31 DIAGNOSIS — G8929 Other chronic pain: Secondary | ICD-10-CM | POA: Diagnosis not present

## 2021-10-31 MED ORDER — ESTRADIOL 0.1 MG/GM VA CREA: 1.0000 | TOPICAL_CREAM | VAGINAL | 4 refills | Status: DC

## 2021-10-31 NOTE — Patient Instructions (Signed)
Prescription for estradiol gel was sent to your pharmacy.  You can try taking this twice a week to see if it helps with your menopause associated symptoms.

## 2021-10-31 NOTE — Progress Notes (Signed)
Subjective:    Patient ID: Melody Sanchez, female    DOB: 06-07-1970, 51 y.o.   MRN: [id number]  Chief Complaint  Patient presents with   Menopause    Patient concerns of menopause. Sx hotflashes, irritability, not resting. Been going on almost 2 years.    Fatigue    Would like to know if she could get Vit. B12   OTHER    Patient has concerns about skin, if it is a symptoms for diabetes. History of DM in family.    Neck Pain    Got steroid and toradol shot yesterday at Pain Clinic: [facility]     HPI Patient was seen today for ongoing concerns.  Patient endorses vasomotor symptoms associated with the menopause.  Pt with h/o hysterectomy 2/2 fibroids.  Pt notes irritability, hot flashes, fatigue, insomnia.  Tried OTC black cohosh, Meno menopause support (vitamin B6, vitamin D, ashwaganda, chasteberry extract, black cohosh) and currently on gabapentin.  Patient worried about other medication options to help with symptoms.  Denies history of blood clots, breast cancer, vaginal dryness.  Patient inquires if she can have a B12 shot to help with fatigue.  B12 392 on 08/29/2021.  Patient notes recent skin changes.  Endorses increased ecchymosis without injury and a blotchy appearing rash that has since faded.  Patient has a picture of her phone of the rash which appears hyperpigmented, reticulated, flat on extremities.  Patient endorses using heating pad but denies falling asleep on it.  Pt followed by pain management for chronic pain in neck and back.  Had Toradol shot and steroid injection yesterday at [facility] pain clinic but notes worsening pain today.  Patient states she is tired of hurting.  Past Medical History:  Diagnosis Date   Anxiety    Arthritis    Neck, Hip   Back pain    Iron deficiency anemia    Migraines    Sinus tachycardia    Uterine fibroid     No Known Allergies  ROS General: Denies fever, chills, night sweats, changes in weight, changes in appetite + fatigue, hot  flashes, irritability HEENT: Denies headaches, ear pain, changes in vision, rhinorrhea, sore throat CV: Denies CP, palpitations, SOB, orthopnea Pulm: Denies SOB, cough, wheezing GI: Denies abdominal pain, nausea, vomiting, diarrhea, constipation GU: Denies dysuria, hematuria, frequency, vaginal discharge Msk: Denies muscle cramps, joint pains + chronic back and neck pain Neuro: Denies weakness, numbness, tingling Skin: Denies rashes, bruising  + bruising, intermittent reticulated rash/blotchy skin Psych: Denies depression, anxiety, hallucinations     Objective:    Blood pressure 130/90, pulse (!) 122, temperature 98.2 F (36.8 C), temperature source Oral, weight 165 lb 3.2 oz (74.9 kg), last menstrual period 01/14/2019, SpO2 96 %.  Gen. Pleasant, well-nourished, in no distress, normal affect   HEENT: [place]/AT, face symmetric, conjunctiva clear, no scleral icterus, PERRLA, EOMI, nares patent without drainage Lungs: no accessory muscle use, CTAB, no wheezes or rales Cardiovascular: tachycardia, no m/r/g, no peripheral edema Musculoskeletal: No deformities, no cyanosis or clubbing, normal tone Neuro:  A&Ox3, CN II-XII intact, normal gait Skin:  Warm, no lesions/ rash.   Wt Readings from Last 3 Encounters:  10/31/21 165 lb 3.2 oz (74.9 kg)  08/29/21 164 lb (74.4 kg)  08/20/21 160 lb (72.6 kg)    Lab Results  Component Value Date   WBC 7.5 08/29/2021   HGB 14.2 08/29/2021   HCT 42.3 08/29/2021   PLT 469.0 (H) 08/29/2021   GLUCOSE 85 11/09/2020  CHOL 234 (H) 11/09/2020   TRIG 97.0 11/09/2020   HDL 70.10 11/09/2020   LDLCALC 145 (H) 11/09/2020   ALT 23 11/09/2020   AST 21 11/09/2020   NA 140 11/09/2020   K 4.4 11/09/2020   CL 103 11/09/2020   CREATININE 0.70 11/09/2020   BUN 12 11/09/2020   CO2 27 11/09/2020   TSH 1.10 11/09/2020   HGBA1C 5.5 11/09/2020    Assessment/Plan:  Menopause  -s/p hysterectomy 2/2 fibroids -increased vasomotor symptoms affecting quality of  life. -No improvement in symptoms with OTC black collage, menopause supplements, or regular gabapentin use. -discussed r/b/a of HRT.  Patient wishes to start HRT. -We will start estradiol vaginal cream twice weekly - Plan: CBC with Differential/Platelet, Luteinizing Hormone, Follicle Stimulating Hormone, estradiol (ESTRACE VAGINAL) 0.1 MG/GM vaginal cream  Erythema ab igne -currently resolved  Other chronic pain -s/p Toradol and steroid injection in neck yesterday -Continue current medications including Nucynta 100 mg 4 times daily Cymbalta, Flexeril 5-10 mg 3 times daily as needed -Continue follow-up with pain management  Fatigue, unspecified type  -Likely multifactorial including chronic pain, vasomotor symptoms from menopause causing insomnia -Patient encouraged to exercise regularly -Sleep hygiene - Plan: Vitamin B12, Hemoglobin A1c, TSH, T4, Free  Ecchymosis  - Plan: TSH, T4, Free, CBC with Differential/Platelet, CMP, Protime-INR  F/u in 1 month, sooner if needed  Grier Mitts, MD

## 2021-11-01 LAB — CBC WITH DIFFERENTIAL/PLATELET
Basophils Absolute: 0.1 10*3/uL (ref 0.0–0.1)
Basophils Relative: 1.3 % (ref 0.0–3.0)
Eosinophils Absolute: 0 10*3/uL (ref 0.0–0.7)
Eosinophils Relative: 0.3 % (ref 0.0–5.0)
HCT: 43 % (ref 36.0–46.0)
Hemoglobin: 14.1 g/dL (ref 12.0–15.0)
Lymphocytes Relative: 30.2 % (ref 12.0–46.0)
Lymphs Abs: 3.3 10*3/uL (ref 0.7–4.0)
MCHC: 32.9 g/dL (ref 30.0–36.0)
MCV: 91.7 fl (ref 78.0–100.0)
Monocytes Absolute: 0.7 10*3/uL (ref 0.1–1.0)
Monocytes Relative: 6.1 % (ref 3.0–12.0)
Neutro Abs: 6.8 10*3/uL (ref 1.4–7.7)
Neutrophils Relative %: 62.1 % (ref 43.0–77.0)
Platelets: 455 10*3/uL — ABNORMAL HIGH (ref 150.0–400.0)
RBC: 4.68 Mil/uL (ref 3.87–5.11)
RDW: 13 % (ref 11.5–15.5)
WBC: 11 10*3/uL — ABNORMAL HIGH (ref 4.0–10.5)

## 2021-11-01 LAB — COMPREHENSIVE METABOLIC PANEL
ALT: 20 U/L (ref 0–35)
AST: 19 U/L (ref 0–37)
Albumin: 4.8 g/dL (ref 3.5–5.2)
Alkaline Phosphatase: 70 U/L (ref 39–117)
BUN: 17 mg/dL (ref 6–23)
CO2: 22 mEq/L (ref 19–32)
Calcium: 9.9 mg/dL (ref 8.4–10.5)
Chloride: 102 mEq/L (ref 96–112)
Creatinine, Ser: 0.85 mg/dL (ref 0.40–1.20)
GFR: 78.29 mL/min (ref >60.00)
Glucose, Bld: 80 mg/dL (ref 70–99)
Potassium: 3.9 mEq/L (ref 3.5–5.1)
Sodium: 137 mEq/L (ref 135–145)
Total Bilirubin: 0.5 mg/dL (ref 0.2–1.2)
Total Protein: 8.2 g/dL (ref 6.0–8.3)

## 2021-11-01 LAB — VITAMIN B12: Vitamin B-12: 467 pg/mL (ref 211–911)

## 2021-11-01 LAB — HEMOGLOBIN A1C: Hgb A1c MFr Bld: 5.9 % (ref 4.6–6.5)

## 2021-11-01 LAB — FOLLICLE STIMULATING HORMONE: FSH: 103.3 m[IU]/mL

## 2021-11-01 LAB — LUTEINIZING HORMONE: LH: 46.05 m[IU]/mL

## 2021-11-01 LAB — PROTIME-INR
INR: 1 ratio (ref 0.8–1.0)
Prothrombin Time: 11.3 s (ref 9.6–13.1)

## 2021-11-01 LAB — T4, FREE: Free T4: 0.71 ng/dL (ref 0.60–1.60)

## 2021-11-01 LAB — TSH: TSH: 0.41 u[IU]/mL (ref 0.35–5.50)

## 2021-11-12 ENCOUNTER — Telehealth: Payer: Self-pay | Admitting: Family Medicine

## 2021-11-12 NOTE — Telephone Encounter (Signed)
Pt returned call regarding labs

## 2021-11-12 NOTE — Telephone Encounter (Signed)
Spoke to patient of lab result. Voiced understanding. Pt brought up FMLA form that she had attach through myChart from April for her back pain. She said she had forgotten to bring it up on her last visit. However she stated she remember talking to Dr. Volanda Napoleon and that Dr. Volanda Napoleon agree to fill out the form. Patient said she has not got any response since then.   Please advise

## 2021-11-15 NOTE — Telephone Encounter (Signed)
Patient is made aware.

## 2021-11-15 NOTE — Telephone Encounter (Signed)
Attempted to follow up with patient. Left message for patient to call back.

## 2021-11-15 NOTE — Telephone Encounter (Signed)
As this provider does not manage pt's back pain she was previously advised that forms would be best completed by her Neurosurgeon as they are actively managing her back pain and her f/u appts are with them.  Pt seen by Pain management, however per pt they do not complete forms.

## 2021-12-01 ENCOUNTER — Other Ambulatory Visit: Payer: Self-pay | Admitting: Family Medicine

## 2021-12-14 ENCOUNTER — Other Ambulatory Visit: Payer: Self-pay

## 2021-12-14 ENCOUNTER — Emergency Department
Admission: EM | Admit: 2021-12-14 | Discharge: 2021-12-14 | Disposition: A | Discharge status: Home / Self Care | Payer: 59 | Attending: Emergency Medicine | Admitting: Emergency Medicine

## 2021-12-14 ENCOUNTER — Encounter: Payer: Self-pay | Admitting: Intensive Care

## 2021-12-14 DIAGNOSIS — M542 Cervicalgia: Secondary | ICD-10-CM

## 2021-12-14 DIAGNOSIS — M545 Low back pain, unspecified: Secondary | ICD-10-CM | POA: Diagnosis present

## 2021-12-14 DIAGNOSIS — M5441 Lumbago with sciatica, right side: Secondary | ICD-10-CM | POA: Diagnosis not present

## 2021-12-14 DIAGNOSIS — M5431 Sciatica, right side: Secondary | ICD-10-CM

## 2021-12-14 MED ORDER — CYCLOBENZAPRINE HCL 10 MG PO TABS: 10.0000 mg | ORAL_TABLET | Freq: Three times a day (TID) | ORAL | 0 refills | Status: AC | PRN

## 2021-12-14 MED ORDER — DEXAMETHASONE SODIUM PHOSPHATE 10 MG/ML IJ SOLN
10.0000 mg | Freq: Once | INTRAMUSCULAR | Status: AC
  Administered 2021-12-14: 10 mg via INTRAMUSCULAR
  Filled 2021-12-14: qty 1

## 2021-12-14 MED ORDER — LIDOCAINE-EPINEPHRINE 2 %-1:100000 IJ SOLN
20.0000 mL | Freq: Once | INTRAMUSCULAR | Status: AC
  Administered 2021-12-14: 20 mL via INTRADERMAL
  Filled 2021-12-14: qty 1

## 2021-12-14 MED ORDER — PREDNISONE 10 MG (21) PO TBPK: ORAL_TABLET | ORAL | 0 refills | Status: DC

## 2021-12-14 MED ORDER — PREDNISONE 20 MG PO TABS
60.0000 mg | ORAL_TABLET | Freq: Once | ORAL | Status: AC
  Administered 2021-12-14: 60 mg via ORAL
  Filled 2021-12-14: qty 3

## 2021-12-14 NOTE — ED Provider Notes (Signed)
[facility] Provider Note   Event Date/Time   First MD Initiated Contact with Patient 12/14/21 1756     (approximate) History  Sciatica  HPI Melody Sanchez is a 51 y.o. female with a stated past medical history of right-sided back pain status post instrumentation and scheduled for her spinal stimulator who presents for acute on chronic right sciatic back pain.  Patient describes significant right buttocks pain that radiates down into her right leg and is worse with movement or stretching at the right leg.  Patient does endorse mild weakness in this right lower extremity as well. ROS: Patient currently denies any vision changes, tinnitus, difficulty speaking, facial droop, sore throat, chest pain, shortness of breath, abdominal pain, nausea/vomiting/diarrhea, dysuria, or bowel/bladder incontinence   Physical Exam  Triage Vital Signs: ED Triage Vitals  Enc Vitals Group     BP 12/14/21 1618 (!) 136/94     Pulse Rate 12/14/21 1618 (!) 120     Resp 12/14/21 1618 20     Temp 12/14/21 1618 98.9 F (37.2 C)     Temp Source 12/14/21 1618 Oral     SpO2 12/14/21 1618 100 %     Weight 12/14/21 1613 160 lb (72.6 kg)     Height 12/14/21 1613 '5\' 1"'$  (1.549 m)     Head Circumference --      Peak Flow --      Pain Score 12/14/21 1613 9     Pain Loc --      Pain Edu? --      Excl. in [place]? --    Most recent vital signs: Vitals:   12/14/21 1618  BP: (!) 136/94  Pulse: (!) 120  Resp: 20  Temp: 98.9 F (37.2 C)  SpO2: 100%   General: Awake, oriented x4. CV:  Good peripheral perfusion.  Resp:  Normal effort.  Abd:  No distention.  Other:  Middle-aged Hispanic overweight female laying in bed in mild distress secondary to pain.  Tenderness to palpation over the right lateral buttocks with positive straight leg raise ED Results / Procedures / Treatments  PROCEDURES: Critical Care performed: No Injection tendon or ligament  Date/Time: 12/14/2021 7:31 PM  Performed  by: Naaman Plummer, MD Authorized by: Naaman Plummer, MD  Consent: Verbal consent obtained. Consent given by: patient Patient identity confirmed: verbally with patient Time out: Immediately prior to procedure a "time out" was called to verify the correct patient, procedure, equipment, support staff and site/side marked as required. Preparation: Patient was prepped and draped in the usual sterile fashion. Local anesthesia used: yes Anesthesia: local infiltration  Anesthesia: Local anesthesia used: yes Local Anesthetic: lidocaine 2% with epinephrine Anesthetic total: 5 mL  Sedation: Patient sedated: no  Patient tolerance: patient tolerated the procedure well with no immediate complications Comments: 71m dexamethasone    MEDICATIONS ORDERED IN ED: Medications  lidocaine-EPINEPHrine (XYLOCAINE W/EPI) 2 %-1:100000 (with pres) injection 20 mL (has no administration in time range)  dexamethasone (DECADRON) injection 10 mg (has no administration in time range)   IMPRESSION / MDM / ASSESSMENT AND PLAN / ED COURSE  I reviewed the triage vital signs and the nursing notes.                             The patient is on the cardiac monitor to evaluate for evidence of arrhythmia and/or significant heart rate changes. Patient's presentation is most consistent with acute presentation  with potential threat to life or bodily function. Patient presents for low back pain. Given History and Exam the patient appears to be at low risk for Spinal Cord Compression Syndrome, Vertebral Malignancy/Mets, acute Spinal Fracture, Vertebral Osteomyelitis, Epidural Abscess, Infected or Obstructing Kidney Stone.  Their presentation appears most likely to be secondary to non-emergent musculoskeletal etiology vs non-emergent disc herniation.  ED Workup: Defer imaging and labwork for outpatient follow up at this time.  Steroid injection placed at bedside to right buttock  Disposition: Discharge. Strict return  precautions discussed with patient with full understanding. Advised patient to follow up promptly with primary care provider   FINAL CLINICAL IMPRESSION(S) / ED DIAGNOSES   Final diagnoses:  None   Rx / DC Orders   ED Discharge Orders     None      Note:  This document was prepared using Dragon voice recognition software and may include unintentional dictation errors.   Naaman Plummer, MD 12/14/21 [id number]

## 2021-12-14 NOTE — ED Provider Triage Note (Signed)
Emergency Medicine Provider Triage Evaluation Note  Melody Sanchez , a 51 y.o. female  was evaluated in triage.  Pt complains of right side sciatica. Pain x 2 days. History of the same.  Physical Exam  BP (!) 136/94 (BP Location: Left Arm)   Pulse (!) 120   Temp 98.9 F (37.2 C) (Oral)   Resp 20   Ht '5\' 1"'$  (1.549 m)   Wt 72.6 kg   LMP 01/14/2019 (Approximate)   SpO2 100%   BMI 30.23 kg/m  Gen:   Awake, no distress   Resp:  Normal effort  MSK:   Moves extremities without difficulty  Other:    Medical Decision Making  Medically screening exam initiated at 4:21 PM.  Appropriate orders placed.  Melody Sanchez was informed that the remainder of the evaluation will be completed by another provider, this initial triage assessment does not replace that evaluation, and the importance of remaining in the ED until their evaluation is complete.    Victorino Dike, FNP 12/14/21 1622

## 2021-12-14 NOTE — ED Triage Notes (Signed)
Patient c/o right side sciatica X2 days

## 2022-06-05 ENCOUNTER — Telehealth: Payer: Self-pay | Admitting: Family Medicine

## 2022-06-05 NOTE — Telephone Encounter (Signed)
Prescription Request  06/05/2022  Is this a "Controlled Substance" medicine? No  LOV: 10/31/2021  What is the name of the medication or equipment? estradiol (ESTRACE VAGINAL) 0.1 MG/GM vaginal cream , DULoxetine (CYMBALTA) 60 MG capsule   Have you contacted your pharmacy to request a refill? Yes   Which pharmacy would you like this sent to?  [id number] - [place], [place] - [address] [place] [address] Phone: [phone] Fax: [phone]    Patient notified that their request is being sent to the clinical staff for review and that they should receive a response within 2 business days.   Please advise at Mobile [phone] (mobile)

## 2022-06-06 DIAGNOSIS — Z6841 Body Mass Index (BMI) 40.0 and over, adult: Secondary | ICD-10-CM | POA: Diagnosis not present

## 2022-06-06 DIAGNOSIS — M545 Low back pain, unspecified: Secondary | ICD-10-CM | POA: Diagnosis not present

## 2022-06-06 DIAGNOSIS — Z79899 Other long term (current) drug therapy: Secondary | ICD-10-CM | POA: Diagnosis not present

## 2022-06-06 DIAGNOSIS — R030 Elevated blood-pressure reading, without diagnosis of hypertension: Secondary | ICD-10-CM | POA: Diagnosis not present

## 2022-06-07 ENCOUNTER — Other Ambulatory Visit: Payer: Self-pay | Admitting: Family Medicine

## 2022-06-07 DIAGNOSIS — Z780 Asymptomatic menopausal state: Secondary | ICD-10-CM

## 2022-06-07 MED ORDER — ESTRADIOL 0.1 MG/GM VA CREA: 1.0000 | TOPICAL_CREAM | VAGINAL | 0 refills | Status: DC

## 2022-06-07 NOTE — Telephone Encounter (Signed)
Attempt to reach pt. Left a voicemail to call us back.  

## 2022-06-07 NOTE — Telephone Encounter (Signed)
Will send in a limited refill for both medications.  Patient will need to schedule a follow-up appointment.

## 2022-06-07 NOTE — Telephone Encounter (Signed)
Correction: Per chart review it appears a prescription for Cymbalta was written in December with 4 refills. Will still have patient schedule follow-up appointment.  Will provide limited refill on Estrace cream.

## 2022-06-10 DIAGNOSIS — Z79899 Other long term (current) drug therapy: Secondary | ICD-10-CM | POA: Diagnosis not present

## 2022-06-10 NOTE — Telephone Encounter (Signed)
Patient returned call

## 2022-06-11 NOTE — Telephone Encounter (Signed)
Spoke to patient. And inform her of the message below.   Scheduled a follow up appt with Dr. Volanda Napoleon on 06/19/2022.

## 2022-06-19 ENCOUNTER — Ambulatory Visit: Payer: Medicaid Other | Admitting: Family Medicine

## 2022-06-19 VITALS — BP 120/100 | HR 100 | Temp 98.6°F | Ht 61.0 in | Wt 156.0 lb

## 2022-06-19 DIAGNOSIS — R7303 Prediabetes: Secondary | ICD-10-CM | POA: Diagnosis not present

## 2022-06-19 DIAGNOSIS — N951 Menopausal and female climacteric states: Secondary | ICD-10-CM

## 2022-06-19 DIAGNOSIS — M545 Low back pain, unspecified: Secondary | ICD-10-CM

## 2022-06-19 DIAGNOSIS — G8929 Other chronic pain: Secondary | ICD-10-CM

## 2022-06-19 DIAGNOSIS — Z0000 Encounter for general adult medical examination without abnormal findings: Secondary | ICD-10-CM

## 2022-06-19 DIAGNOSIS — I10 Essential (primary) hypertension: Secondary | ICD-10-CM | POA: Diagnosis not present

## 2022-06-19 DIAGNOSIS — E782 Mixed hyperlipidemia: Secondary | ICD-10-CM | POA: Diagnosis not present

## 2022-06-19 MED ORDER — BLOOD PRESSURE CUFF MISC
1.0000 | Freq: Every day | 0 refills | Status: AC
Start: 2022-06-19 — End: ?

## 2022-06-19 MED ORDER — DULOXETINE HCL 60 MG PO CPEP: 120.0000 mg | ORAL_CAPSULE | Freq: Every day | ORAL | 3 refills | Status: DC

## 2022-06-19 NOTE — Patient Instructions (Addendum)
Colonoscopy is not due until September 2026, as you had a normal colonoscopy 01/16/2015.

## 2022-06-19 NOTE — Progress Notes (Signed)
Established Patient Office Visit   Subjective  Patient ID: Melody Sanchez, female    DOB: [birth year]  Age: 52 y.o. MRN: [id number]  Chief Complaint  Patient presents with   Medication Management    Patient is a 52 year old female with PMH SIG for anxiety, obesity, chronic back pain who was seen for CPE.  Patient states she is doing okay for the most part.  Plans to look into water aerobics/alternative therapies for chronic back pain.  Notes some improvement in symptoms since taking Cymbalta.  Patient inquires about refill on estradiol vaginal cream for hot flashes/menopausal symptoms.  Per chart review appears Rx sent to pharmacy on 06/10/2022.  Patient inquires if her colonoscopy is due.  Plans to schedule Pap though has history of hysterectomy.      ROS Negative unless stated above    Objective:     BP (!) 120/100   Pulse 100   Temp 98.6 F (37 C) (Oral)   Ht 5\' 1"  (1.549 m)   Wt 156 lb (70.8 kg)   LMP 01/14/2019 (Approximate)   SpO2 100%   BMI 29.48 kg/m    Physical Exam Constitutional:      Appearance: Normal appearance.  HENT:     Head: Normocephalic and atraumatic.     Right Ear: Tympanic membrane, ear canal and external ear normal.     Left Ear: Tympanic membrane, ear canal and external ear normal.     Nose: Nose normal.     Mouth/Throat:     Mouth: Mucous membranes are moist.     Pharynx: No oropharyngeal exudate or posterior oropharyngeal erythema.  Eyes:     General: No scleral icterus.    Extraocular Movements: Extraocular movements intact.     Conjunctiva/sclera: Conjunctivae normal.     Pupils: Pupils are equal, round, and reactive to light.  Neck:     Thyroid: No thyromegaly.  Cardiovascular:     Rate and Rhythm: Normal rate and regular rhythm.     Pulses: Normal pulses.     Heart sounds: Normal heart sounds. No murmur heard.    No friction rub.  Pulmonary:     Effort: Pulmonary effort is normal.     Breath sounds: Normal breath sounds. No  wheezing, rhonchi or rales.  Abdominal:     General: Bowel sounds are normal.     Palpations: Abdomen is soft.     Tenderness: There is no abdominal tenderness.  Musculoskeletal:        General: No deformity. Normal range of motion.  Lymphadenopathy:     Cervical: No cervical adenopathy.  Skin:    General: Skin is warm and dry.     Findings: No lesion.  Neurological:     General: No focal deficit present.     Mental Status: She is alert and oriented to person, place, and time.     Motor: Weakness present.     Comments: Weakness in RLE> LLE.  Normal tone.  Grip strength 4/5 b/l.  Psychiatric:        Mood and Affect: Mood normal.        Thought Content: Thought content normal.     No results found for any visits on 06/19/22.    Assessment & Plan:  Well adult exam -Anticipatory guidance given including wearing seatbelts, smoke detectors in the home, increasing physical activity, increasing p.o. intake of water and vegetables. -labs -immunizations reviewed -Patient to schedule mammogram as last done 04/11/2021 -Colonoscopy done 01/16/2015.  Next  colonoscopy 2026. -pap-History of hysterectomy. -Next CPE in 1 year  Essential hypertension -Prior elevation noted on 10/31/2021.  Pain likely contributing. -Obtain labs -Pt to monitor BP at home -Will have follow-up in the next 3-4 weeks to start medication for continued elevation. -Lifestyle modifications encouraged. -     Blood Pressure Cuff; 1 Device by Does not apply route daily.  Dispense: 1 each; Refill: 0 -     Comprehensive metabolic panel -     Lipid panel -     TSH -     T4, free  Hot flashes due to menopause -OTC supplements and gabapentin ineffective for symptom management -rx for estradiol vaginal cream at pharmacy for pick up -     TSH -     T4, free  Prediabetes -hgb A1c 5.9% on 10/31/21 -Lifestyle modifications -     Hemoglobin A1c  Chronic bilateral low back pain without sciatica -Continue follow-up with  specialist -Continue Cymbalta Rams daily -     CBC with Differential/Platelet -     DULoxetine HCl; Take 2 capsules (120 mg total) by mouth daily.  Dispense: 60 capsule; Refill: 3  Mixed hyperlipidemia -Lifestyle modifications -Total cholesterol 234, LDL 145, triglycerides 97, HDL 70 on 11/09/2020 -     Lipid panel    Return in about 4 weeks (around 07/17/2022) for blood pressure re-check.   Billie Ruddy, MD

## 2022-07-01 ENCOUNTER — Telehealth: Payer: Self-pay

## 2022-07-01 NOTE — Telephone Encounter (Signed)
PA  for Estradiol  Vaginal Cream was sent to [facility].  Melody Sanchez (Key: [id number])

## 2022-07-03 DIAGNOSIS — Z79899 Other long term (current) drug therapy: Secondary | ICD-10-CM | POA: Diagnosis not present

## 2022-07-03 DIAGNOSIS — R030 Elevated blood-pressure reading, without diagnosis of hypertension: Secondary | ICD-10-CM | POA: Diagnosis not present

## 2022-07-03 DIAGNOSIS — M545 Low back pain, unspecified: Secondary | ICD-10-CM | POA: Diagnosis not present

## 2022-07-03 DIAGNOSIS — Z6841 Body Mass Index (BMI) 40.0 and over, adult: Secondary | ICD-10-CM | POA: Diagnosis not present

## 2022-07-08 DIAGNOSIS — Z79899 Other long term (current) drug therapy: Secondary | ICD-10-CM | POA: Diagnosis not present

## 2022-07-11 NOTE — Telephone Encounter (Signed)
PA was denied.  Medication is non-preferred drug.   Patient has to trial and fail two of following preferred drugs: Premarin Vaginal Cream, Vagifem Vaginal tablet, estring Vaginal Ring.   Attempted to reach pt. Left a voicemail to call us back.

## 2022-07-17 ENCOUNTER — Ambulatory Visit: Admitting: Family Medicine

## 2022-07-22 ENCOUNTER — Ambulatory Visit: Admitting: Family Medicine

## 2022-07-25 ENCOUNTER — Encounter: Payer: Self-pay | Admitting: Family Medicine

## 2022-07-25 ENCOUNTER — Ambulatory Visit (INDEPENDENT_AMBULATORY_CARE_PROVIDER_SITE_OTHER): Admitting: Family Medicine

## 2022-07-25 VITALS — BP 144/90 | HR 105 | Temp 98.9°F | Ht 61.0 in | Wt 159.0 lb

## 2022-07-25 DIAGNOSIS — I10 Essential (primary) hypertension: Secondary | ICD-10-CM

## 2022-07-25 DIAGNOSIS — N951 Menopausal and female climacteric states: Secondary | ICD-10-CM | POA: Diagnosis not present

## 2022-07-25 DIAGNOSIS — G8929 Other chronic pain: Secondary | ICD-10-CM

## 2022-07-25 DIAGNOSIS — M545 Low back pain, unspecified: Secondary | ICD-10-CM

## 2022-07-25 DIAGNOSIS — R7303 Prediabetes: Secondary | ICD-10-CM

## 2022-07-25 DIAGNOSIS — E782 Mixed hyperlipidemia: Secondary | ICD-10-CM

## 2022-07-25 LAB — LIPID PANEL
Cholesterol: 258 mg/dL — ABNORMAL HIGH (ref 0–200)
HDL: 80.3 mg/dL (ref >39.00)
LDL Cholesterol: 148 mg/dL — ABNORMAL HIGH (ref 0–99)
NonHDL: 178.01
Total CHOL/HDL Ratio: 3
Triglycerides: 152 mg/dL — ABNORMAL HIGH (ref 0.0–149.0)
VLDL: 30.4 mg/dL (ref 0.0–40.0)

## 2022-07-25 LAB — COMPREHENSIVE METABOLIC PANEL
ALT: 20 U/L (ref 0–35)
AST: 20 U/L (ref 0–37)
Albumin: 4.4 g/dL (ref 3.5–5.2)
Alkaline Phosphatase: 68 U/L (ref 39–117)
BUN: 11 mg/dL (ref 6–23)
CO2: 27 mEq/L (ref 19–32)
Calcium: 9.6 mg/dL (ref 8.4–10.5)
Chloride: 100 mEq/L (ref 96–112)
Creatinine, Ser: 0.7 mg/dL (ref 0.40–1.20)
GFR: 98.33 mL/min (ref >60.00)
Glucose, Bld: 68 mg/dL — ABNORMAL LOW (ref 70–99)
Potassium: 4.1 mEq/L (ref 3.5–5.1)
Sodium: 138 mEq/L (ref 135–145)
Total Bilirubin: 0.5 mg/dL (ref 0.2–1.2)
Total Protein: 7.7 g/dL (ref 6.0–8.3)

## 2022-07-25 LAB — CBC WITH DIFFERENTIAL/PLATELET
Basophils Absolute: 0 10*3/uL (ref 0.0–0.1)
Basophils Relative: 0.8 % (ref 0.0–3.0)
Eosinophils Absolute: 0.1 10*3/uL (ref 0.0–0.7)
Eosinophils Relative: 2.3 % (ref 0.0–5.0)
HCT: 42.7 % (ref 36.0–46.0)
Hemoglobin: 14.2 g/dL (ref 12.0–15.0)
Lymphocytes Relative: 35.6 % (ref 12.0–46.0)
Lymphs Abs: 1.7 10*3/uL (ref 0.7–4.0)
MCHC: 33.2 g/dL (ref 30.0–36.0)
MCV: 91.8 fl (ref 78.0–100.0)
Monocytes Absolute: 0.4 10*3/uL (ref 0.1–1.0)
Monocytes Relative: 7.5 % (ref 3.0–12.0)
Neutro Abs: 2.6 10*3/uL (ref 1.4–7.7)
Neutrophils Relative %: 53.8 % (ref 43.0–77.0)
Platelets: 349 10*3/uL (ref 150.0–400.0)
RBC: 4.65 Mil/uL (ref 3.87–5.11)
RDW: 13.4 % (ref 11.5–15.5)
WBC: 4.9 10*3/uL (ref 4.0–10.5)

## 2022-07-25 LAB — T4, FREE: Free T4: 1.19 ng/dL (ref 0.60–1.60)

## 2022-07-25 LAB — HEMOGLOBIN A1C: Hgb A1c MFr Bld: 5.6 % (ref 4.6–6.5)

## 2022-07-25 LAB — TSH: TSH: 1.89 u[IU]/mL (ref 0.35–5.50)

## 2022-07-25 MED ORDER — KETOROLAC TROMETHAMINE 60 MG/2ML IM SOLN
60.0000 mg | Freq: Once | INTRAMUSCULAR | Status: AC
  Administered 2022-07-25: 60 mg via INTRAMUSCULAR

## 2022-07-25 NOTE — Progress Notes (Signed)
   Established Patient Office Visit   Subjective  Patient ID: Melody Sanchez, female    DOB: [birth year]  Age: 52 y.o. MRN: [id number]  Chief Complaint  Patient presents with   Medical Management of Chronic Issues    52 year old female seen for follow-up and labs.  Labs ordered at previous [facility].  Patient notes increased back pain over the last week.  Started exercising in the last 2 weeks.  Losing weight.  Has yet to pick up home bp monitor.      ROS Negative unless stated above    Objective:     BP (!) 144/90 (BP Location: Left Arm, Patient Position: Sitting, Cuff Size: Normal)   Pulse (!) 105   Temp 98.9 F (37.2 C) (Oral)   Ht 5\' 1"  (1.549 m)   Wt 159 lb (72.1 kg)   LMP 01/14/2019 (Approximate)   SpO2 97%   BMI 30.04 kg/m    Physical Exam Constitutional:      General: She is not in acute distress.    Appearance: Normal appearance.  HENT:     Head: Normocephalic and atraumatic.     Nose: Nose normal.     Mouth/Throat:     Mouth: Mucous membranes are moist.  Eyes:     Extraocular Movements: Extraocular movements intact.     Conjunctiva/sclera: Conjunctivae normal.     Pupils: Pupils are equal, round, and reactive to light.  Cardiovascular:     Rate and Rhythm: Normal rate and regular rhythm.     Heart sounds: Normal heart sounds. No murmur heard.    No gallop.  Pulmonary:     Effort: Pulmonary effort is normal. No respiratory distress.     Breath sounds: Normal breath sounds. No wheezing, rhonchi or rales.  Skin:    General: Skin is warm and dry.  Neurological:     Mental Status: She is alert and oriented to person, place, and time.      No results found for any visits on 07/25/22.    Assessment & Plan:  Chronic bilateral low back pain without sciatica -     Ketorolac Tromethamine -     CBC with Differential/Platelet  Essential hypertension -     TSH -     T4, free -     Lipid panel -     Comprehensive metabolic panel  Hot flashes due to  menopause -PA denied for estradiol vaginal cream.  Taken off med list. -     TSH -     T4, free  Mixed hyperlipidemia -     Lipid panel  Prediabetes -     Hemoglobin A1c   Obtain previously ordered labs.  Toradol injection given for chronic back pain.  Advised to continue stretching and alternative therapies for pain management.  Continue follow-up with neurosurgery/Ortho as needed.  Return if symptoms worsen or fail to improve.   Billie Ruddy, MD

## 2022-08-05 DIAGNOSIS — Z79899 Other long term (current) drug therapy: Secondary | ICD-10-CM | POA: Diagnosis not present

## 2022-08-05 DIAGNOSIS — M545 Low back pain, unspecified: Secondary | ICD-10-CM | POA: Diagnosis not present

## 2022-08-05 DIAGNOSIS — Z6841 Body Mass Index (BMI) 40.0 and over, adult: Secondary | ICD-10-CM | POA: Diagnosis not present

## 2022-08-05 DIAGNOSIS — R030 Elevated blood-pressure reading, without diagnosis of hypertension: Secondary | ICD-10-CM | POA: Diagnosis not present

## 2022-08-08 DIAGNOSIS — Z79899 Other long term (current) drug therapy: Secondary | ICD-10-CM | POA: Diagnosis not present

## 2022-09-02 DIAGNOSIS — Z6841 Body Mass Index (BMI) 40.0 and over, adult: Secondary | ICD-10-CM | POA: Diagnosis not present

## 2022-09-02 DIAGNOSIS — R030 Elevated blood-pressure reading, without diagnosis of hypertension: Secondary | ICD-10-CM | POA: Diagnosis not present

## 2022-09-02 DIAGNOSIS — M545 Low back pain, unspecified: Secondary | ICD-10-CM | POA: Diagnosis not present

## 2022-09-02 DIAGNOSIS — Z79899 Other long term (current) drug therapy: Secondary | ICD-10-CM | POA: Diagnosis not present

## 2022-09-24 ENCOUNTER — Other Ambulatory Visit: Payer: Self-pay | Admitting: Family Medicine

## 2022-09-24 DIAGNOSIS — M545 Low back pain, unspecified: Secondary | ICD-10-CM

## 2022-10-01 ENCOUNTER — Other Ambulatory Visit: Payer: Self-pay | Admitting: Family Medicine

## 2022-10-01 DIAGNOSIS — Z1231 Encounter for screening mammogram for malignant neoplasm of breast: Secondary | ICD-10-CM

## 2022-10-01 DIAGNOSIS — Z6841 Body Mass Index (BMI) 40.0 and over, adult: Secondary | ICD-10-CM | POA: Diagnosis not present

## 2022-10-01 DIAGNOSIS — R030 Elevated blood-pressure reading, without diagnosis of hypertension: Secondary | ICD-10-CM | POA: Diagnosis not present

## 2022-10-01 DIAGNOSIS — M545 Low back pain, unspecified: Secondary | ICD-10-CM | POA: Diagnosis not present

## 2022-10-01 DIAGNOSIS — Z79899 Other long term (current) drug therapy: Secondary | ICD-10-CM | POA: Diagnosis not present

## 2022-10-01 DIAGNOSIS — M25551 Pain in right hip: Secondary | ICD-10-CM | POA: Diagnosis not present

## 2022-10-01 DIAGNOSIS — M25552 Pain in left hip: Secondary | ICD-10-CM | POA: Diagnosis not present

## 2022-10-07 DIAGNOSIS — F329 Major depressive disorder, single episode, unspecified: Secondary | ICD-10-CM | POA: Diagnosis not present

## 2022-10-17 ENCOUNTER — Ambulatory Visit
Admission: RE | Admit: 2022-10-17 | Discharge: 2022-10-17 | Disposition: A | Discharge status: Home / Self Care | Payer: Medicaid Other | Source: Ambulatory Visit

## 2022-10-17 DIAGNOSIS — Z1231 Encounter for screening mammogram for malignant neoplasm of breast: Secondary | ICD-10-CM | POA: Diagnosis not present

## 2022-10-21 DIAGNOSIS — D539 Nutritional anemia, unspecified: Secondary | ICD-10-CM | POA: Diagnosis not present

## 2022-10-21 DIAGNOSIS — R5383 Other fatigue: Secondary | ICD-10-CM | POA: Diagnosis not present

## 2022-10-21 DIAGNOSIS — E559 Vitamin D deficiency, unspecified: Secondary | ICD-10-CM | POA: Diagnosis not present

## 2022-10-21 DIAGNOSIS — E7800 Pure hypercholesterolemia, unspecified: Secondary | ICD-10-CM | POA: Diagnosis not present

## 2022-10-21 DIAGNOSIS — Z131 Encounter for screening for diabetes mellitus: Secondary | ICD-10-CM | POA: Diagnosis not present

## 2022-10-21 DIAGNOSIS — Z79899 Other long term (current) drug therapy: Secondary | ICD-10-CM | POA: Diagnosis not present

## 2022-10-21 DIAGNOSIS — E88819 Insulin resistance, unspecified: Secondary | ICD-10-CM | POA: Diagnosis not present

## 2022-10-23 DIAGNOSIS — F411 Generalized anxiety disorder: Secondary | ICD-10-CM | POA: Diagnosis not present

## 2022-11-04 DIAGNOSIS — Z6828 Body mass index (BMI) 28.0-28.9, adult: Secondary | ICD-10-CM | POA: Diagnosis not present

## 2022-11-04 DIAGNOSIS — M545 Low back pain, unspecified: Secondary | ICD-10-CM | POA: Diagnosis not present

## 2022-11-04 DIAGNOSIS — M25552 Pain in left hip: Secondary | ICD-10-CM | POA: Diagnosis not present

## 2022-11-04 DIAGNOSIS — R030 Elevated blood-pressure reading, without diagnosis of hypertension: Secondary | ICD-10-CM | POA: Diagnosis not present

## 2022-11-04 DIAGNOSIS — Z79899 Other long term (current) drug therapy: Secondary | ICD-10-CM | POA: Diagnosis not present

## 2022-11-04 DIAGNOSIS — Z6841 Body Mass Index (BMI) 40.0 and over, adult: Secondary | ICD-10-CM | POA: Diagnosis not present

## 2022-11-04 DIAGNOSIS — M25551 Pain in right hip: Secondary | ICD-10-CM | POA: Diagnosis not present

## 2022-11-27 DIAGNOSIS — R945 Abnormal results of liver function studies: Secondary | ICD-10-CM | POA: Diagnosis not present

## 2022-12-02 DIAGNOSIS — R030 Elevated blood-pressure reading, without diagnosis of hypertension: Secondary | ICD-10-CM | POA: Diagnosis not present

## 2022-12-02 DIAGNOSIS — M25552 Pain in left hip: Secondary | ICD-10-CM | POA: Diagnosis not present

## 2022-12-02 DIAGNOSIS — M545 Low back pain, unspecified: Secondary | ICD-10-CM | POA: Diagnosis not present

## 2022-12-02 DIAGNOSIS — Z79899 Other long term (current) drug therapy: Secondary | ICD-10-CM | POA: Diagnosis not present

## 2022-12-02 DIAGNOSIS — B001 Herpesviral vesicular dermatitis: Secondary | ICD-10-CM | POA: Diagnosis not present

## 2022-12-02 DIAGNOSIS — Z6828 Body mass index (BMI) 28.0-28.9, adult: Secondary | ICD-10-CM | POA: Diagnosis not present

## 2022-12-02 DIAGNOSIS — Z6841 Body Mass Index (BMI) 40.0 and over, adult: Secondary | ICD-10-CM | POA: Diagnosis not present

## 2022-12-02 DIAGNOSIS — M25551 Pain in right hip: Secondary | ICD-10-CM | POA: Diagnosis not present

## 2022-12-11 DIAGNOSIS — K7689 Other specified diseases of liver: Secondary | ICD-10-CM | POA: Insufficient documentation

## 2022-12-11 DIAGNOSIS — K8020 Calculus of gallbladder without cholecystitis without obstruction: Secondary | ICD-10-CM | POA: Insufficient documentation

## 2022-12-23 DIAGNOSIS — R7401 Elevation of levels of liver transaminase levels: Secondary | ICD-10-CM | POA: Diagnosis not present

## 2022-12-23 DIAGNOSIS — Z1211 Encounter for screening for malignant neoplasm of colon: Secondary | ICD-10-CM | POA: Diagnosis not present

## 2022-12-23 DIAGNOSIS — Z800 Family history of malignant neoplasm of digestive organs: Secondary | ICD-10-CM | POA: Diagnosis not present

## 2022-12-30 DIAGNOSIS — M25551 Pain in right hip: Secondary | ICD-10-CM | POA: Diagnosis not present

## 2022-12-30 DIAGNOSIS — B001 Herpesviral vesicular dermatitis: Secondary | ICD-10-CM | POA: Diagnosis not present

## 2022-12-30 DIAGNOSIS — R030 Elevated blood-pressure reading, without diagnosis of hypertension: Secondary | ICD-10-CM | POA: Diagnosis not present

## 2022-12-30 DIAGNOSIS — M25552 Pain in left hip: Secondary | ICD-10-CM | POA: Diagnosis not present

## 2022-12-30 DIAGNOSIS — Z6841 Body Mass Index (BMI) 40.0 and over, adult: Secondary | ICD-10-CM | POA: Diagnosis not present

## 2022-12-30 DIAGNOSIS — Z79899 Other long term (current) drug therapy: Secondary | ICD-10-CM | POA: Diagnosis not present

## 2022-12-30 DIAGNOSIS — M545 Low back pain, unspecified: Secondary | ICD-10-CM | POA: Diagnosis not present

## 2023-01-07 DIAGNOSIS — G44001 Cluster headache syndrome, unspecified, intractable: Secondary | ICD-10-CM | POA: Diagnosis not present

## 2023-01-28 DIAGNOSIS — N898 Other specified noninflammatory disorders of vagina: Secondary | ICD-10-CM | POA: Insufficient documentation

## 2023-01-28 DIAGNOSIS — R635 Abnormal weight gain: Secondary | ICD-10-CM | POA: Insufficient documentation

## 2023-01-28 DIAGNOSIS — B9689 Other specified bacterial agents as the cause of diseases classified elsewhere: Secondary | ICD-10-CM | POA: Insufficient documentation

## 2023-01-28 DIAGNOSIS — B3731 Acute candidiasis of vulva and vagina: Secondary | ICD-10-CM | POA: Insufficient documentation

## 2023-01-29 ENCOUNTER — Encounter: Payer: Self-pay | Admitting: Family Medicine

## 2023-01-29 ENCOUNTER — Other Ambulatory Visit (HOSPITAL_COMMUNITY)
Admission: RE | Admit: 2023-01-29 | Discharge: 2023-01-29 | Disposition: A | Discharge status: Home / Self Care | Payer: Medicaid Other | Source: Ambulatory Visit | Attending: Family Medicine | Admitting: Family Medicine

## 2023-01-29 ENCOUNTER — Ambulatory Visit (INDEPENDENT_AMBULATORY_CARE_PROVIDER_SITE_OTHER): Admitting: Family Medicine

## 2023-01-29 VITALS — BP 120/72 | HR 72 | Temp 98.7°F | Ht 61.0 in | Wt 145.2 lb

## 2023-01-29 DIAGNOSIS — Z1159 Encounter for screening for other viral diseases: Secondary | ICD-10-CM | POA: Diagnosis not present

## 2023-01-29 DIAGNOSIS — Z129 Encounter for screening for malignant neoplasm, site unspecified: Secondary | ICD-10-CM | POA: Diagnosis not present

## 2023-01-29 DIAGNOSIS — G44009 Cluster headache syndrome, unspecified, not intractable: Secondary | ICD-10-CM | POA: Diagnosis not present

## 2023-01-29 DIAGNOSIS — Z23 Encounter for immunization: Secondary | ICD-10-CM

## 2023-01-29 NOTE — Progress Notes (Signed)
Established Patient Office Visit   Subjective  Patient ID: Melody Sanchez, female    DOB: 1971/05/02  Age: 52 y.o. MRN: [id number]  Chief Complaint  Patient presents with   Headache    Cluster headaches , Flu, Hep C screening, and pap    Pt is a 52 yo female seen for f/u and Pap.  Pt seen at [facility] for HA causing L eye to water, nose to run, and light sensitivity.  BP was 121/80.  Symptoms were thought to be due to a cluster HA.  Pt tried rubbing temples and using a cold compress.  Pt does not recall having these HAs certain times of the yr.   Pt has been working on weight loss, lost 15 pounds.  Back pain is slightly improved.  Nucynta was decreased.  Still planning on looking into water aerobics and other exercise opportunities.  Patient with history of hysterectomy and salpingectomy 2020.  Requesting Pap.    Past Medical History:  Diagnosis Date   Anxiety    Arthritis    Neck, Hip   Back pain    Iron deficiency anemia    Migraines    Sinus tachycardia    Uterine fibroid    Past Surgical History:  Procedure Laterality Date   BREAST BIOPSY Right 08/27/2019   CESAREAN SECTION     x3   LAMINECTOMY  10/2017   ROBOTIC ASSISTED LAPAROSCOPIC HYSTERECTOMY AND SALPINGECTOMY Bilateral 02/17/2019   Procedure: XI ROBOTIC ASSISTED LAPAROSCOPIC HYSTERECTOMY WITH BILATERAL  SALPINGECTOMY;  Surgeon: Gerald Leitz, MD;  Location: [facility];  Service: Gynecology;  Laterality: Bilateral;   TUBAL LIGATION     Social History   Tobacco Use   Smoking status: Never   Smokeless tobacco: Never  Vaping Use   Vaping status: Never Used  Substance Use Topics   Alcohol use: Yes    Comment: ocassionally    Drug use: No   History reviewed. No pertinent family history. No Known Allergies    ROS Negative unless stated above    Objective:     BP 120/72 (BP Location: Right Arm, Patient Position: Sitting, Cuff Size: Normal)   Pulse 72   Temp 98.7 F (37.1 C)   Ht 5\' 1"  (1.549  m)   Wt 145 lb 3.2 oz (65.9 kg)   LMP 01/14/2019 (Approximate)   BMI 27.44 kg/m  BP Readings from Last 3 Encounters:  01/29/23 120/72  07/25/22 (!) 144/90  06/19/22 (!) 120/100   Wt Readings from Last 3 Encounters:  01/29/23 145 lb 3.2 oz (65.9 kg)  07/25/22 159 lb (72.1 kg)  06/19/22 156 lb (70.8 kg)      Physical Exam Constitutional:      General: She is not in acute distress.    Appearance: Normal appearance.  HENT:     Head: Normocephalic and atraumatic.     Nose: Nose normal.     Mouth/Throat:     Mouth: Mucous membranes are moist.  Cardiovascular:     Rate and Rhythm: Normal rate and regular rhythm.     Heart sounds: Normal heart sounds. No murmur heard.    No gallop.  Pulmonary:     Effort: Pulmonary effort is normal. No respiratory distress.     Breath sounds: Normal breath sounds. No wheezing, rhonchi or rales.  Skin:    General: Skin is warm and dry.  Neurological:     Mental Status: She is alert and oriented to person, place, and time.  No results found for any visits on 01/29/23.    Assessment & Plan:  Cluster headache, not intractable, unspecified chronicity pattern -Discussed symptoms and pattern associated with cluster headaches. -Discussed headache prevention and treatment options. -Given new type of headache referral placed to neurology. -Headache diary -     Ambulatory referral to Neurology  Need for influenza vaccination -     Flu vaccine trivalent PF, 6mos and older(Flulaval,Afluria,Fluarix,Fluzone)  Need for hepatitis C screening test -     Hepatitis C antibody; Future  Cancer screening -Status post hysterectomy/salpingectomy 2020. -Pap to evaluate for abnormal cells and cervical cuff. -     Cytology - PAP   Return if symptoms worsen or fail to improve.   Deeann Saint, MD

## 2023-02-03 LAB — CYTOLOGY - PAP
Comment: NEGATIVE
Comment: NEGATIVE
Diagnosis: NEGATIVE
High risk HPV: NEGATIVE
Trichomonas: NEGATIVE

## 2023-02-07 ENCOUNTER — Other Ambulatory Visit: Payer: Self-pay | Admitting: Family Medicine

## 2023-02-07 DIAGNOSIS — Z1211 Encounter for screening for malignant neoplasm of colon: Secondary | ICD-10-CM

## 2023-02-07 DIAGNOSIS — Z1212 Encounter for screening for malignant neoplasm of rectum: Secondary | ICD-10-CM

## 2023-02-17 DIAGNOSIS — Z6826 Body mass index (BMI) 26.0-26.9, adult: Secondary | ICD-10-CM | POA: Diagnosis not present

## 2023-02-17 DIAGNOSIS — M25551 Pain in right hip: Secondary | ICD-10-CM | POA: Diagnosis not present

## 2023-02-17 DIAGNOSIS — M25552 Pain in left hip: Secondary | ICD-10-CM | POA: Diagnosis not present

## 2023-02-17 DIAGNOSIS — M545 Low back pain, unspecified: Secondary | ICD-10-CM | POA: Diagnosis not present

## 2023-02-17 DIAGNOSIS — B001 Herpesviral vesicular dermatitis: Secondary | ICD-10-CM | POA: Diagnosis not present

## 2023-02-17 DIAGNOSIS — R030 Elevated blood-pressure reading, without diagnosis of hypertension: Secondary | ICD-10-CM | POA: Diagnosis not present

## 2023-02-17 DIAGNOSIS — Z6841 Body Mass Index (BMI) 40.0 and over, adult: Secondary | ICD-10-CM | POA: Diagnosis not present

## 2023-02-17 DIAGNOSIS — Z79899 Other long term (current) drug therapy: Secondary | ICD-10-CM | POA: Diagnosis not present

## 2023-03-04 ENCOUNTER — Other Ambulatory Visit: Payer: Self-pay | Admitting: Family Medicine

## 2023-03-04 DIAGNOSIS — G8929 Other chronic pain: Secondary | ICD-10-CM

## 2023-03-20 DIAGNOSIS — M25551 Pain in right hip: Secondary | ICD-10-CM | POA: Diagnosis not present

## 2023-03-20 DIAGNOSIS — Z6826 Body mass index (BMI) 26.0-26.9, adult: Secondary | ICD-10-CM | POA: Diagnosis not present

## 2023-03-20 DIAGNOSIS — M545 Low back pain, unspecified: Secondary | ICD-10-CM | POA: Diagnosis not present

## 2023-03-20 DIAGNOSIS — R030 Elevated blood-pressure reading, without diagnosis of hypertension: Secondary | ICD-10-CM | POA: Diagnosis not present

## 2023-03-20 DIAGNOSIS — M25552 Pain in left hip: Secondary | ICD-10-CM | POA: Diagnosis not present

## 2023-03-20 DIAGNOSIS — Z6841 Body Mass Index (BMI) 40.0 and over, adult: Secondary | ICD-10-CM | POA: Diagnosis not present

## 2023-03-20 DIAGNOSIS — Z79899 Other long term (current) drug therapy: Secondary | ICD-10-CM | POA: Diagnosis not present

## 2023-04-07 ENCOUNTER — Ambulatory Visit: Admitting: Family Medicine

## 2023-04-07 ENCOUNTER — Encounter: Payer: Self-pay | Admitting: Family Medicine

## 2023-04-07 VITALS — BP 122/76 | HR 57 | Temp 98.4°F | Ht 61.0 in | Wt 147.2 lb

## 2023-04-07 DIAGNOSIS — J069 Acute upper respiratory infection, unspecified: Secondary | ICD-10-CM | POA: Diagnosis not present

## 2023-04-07 DIAGNOSIS — Z1211 Encounter for screening for malignant neoplasm of colon: Secondary | ICD-10-CM

## 2023-04-07 DIAGNOSIS — Z780 Asymptomatic menopausal state: Secondary | ICD-10-CM | POA: Diagnosis not present

## 2023-04-07 DIAGNOSIS — E782 Mixed hyperlipidemia: Secondary | ICD-10-CM

## 2023-04-07 DIAGNOSIS — R21 Rash and other nonspecific skin eruption: Secondary | ICD-10-CM

## 2023-04-07 DIAGNOSIS — R6889 Other general symptoms and signs: Secondary | ICD-10-CM

## 2023-04-07 DIAGNOSIS — R7303 Prediabetes: Secondary | ICD-10-CM

## 2023-04-07 DIAGNOSIS — I10 Essential (primary) hypertension: Secondary | ICD-10-CM

## 2023-04-07 DIAGNOSIS — G44009 Cluster headache syndrome, unspecified, not intractable: Secondary | ICD-10-CM

## 2023-04-07 DIAGNOSIS — Z1159 Encounter for screening for other viral diseases: Secondary | ICD-10-CM

## 2023-04-07 LAB — POCT RAPID STREP A (OFFICE): Rapid Strep A Screen: NEGATIVE

## 2023-04-07 LAB — POC COVID19 BINAXNOW: SARS Coronavirus 2 Ag: NEGATIVE

## 2023-04-07 LAB — POCT INFLUENZA A/B
Influenza A, POC: NEGATIVE
Influenza B, POC: NEGATIVE

## 2023-04-07 NOTE — Progress Notes (Unsigned)
   Established Patient Office Visit   Subjective  Patient ID: Melody Sanchez, female    DOB: [birth year]  Age: 52 y.o. MRN: [id number]  Chief Complaint  Patient presents with  . Menopause    HPI  {History (Optional):23778}  ROS Negative unless stated above    Objective:     BP 122/76 (BP Location: Left Arm, Patient Position: Sitting, Cuff Size: Normal)   Pulse (!) 57   Temp 98.4 F (36.9 C) (Oral)   Ht 5\' 1"  (1.549 m)   Wt 147 lb 3.2 oz (66.8 kg)   LMP 01/14/2019 (Approximate)   SpO2 95%   BMI 27.81 kg/m  {Vitals History (Optional):23777}  Physical Exam   No results found for any visits on 04/07/23.    Assessment & Plan:  Menopause  Essential hypertension  Mixed hyperlipidemia  Prediabetes    No follow-ups on file.   Deeann Saint, MD

## 2023-04-09 LAB — COMPREHENSIVE METABOLIC PANEL
ALT: 12 U/L (ref 0–35)
AST: 14 U/L (ref 0–37)
Albumin: 4.8 g/dL (ref 3.5–5.2)
Alkaline Phosphatase: 72 U/L (ref 39–117)
BUN: 11 mg/dL (ref 6–23)
CO2: 25 meq/L (ref 19–32)
Calcium: 9.9 mg/dL (ref 8.4–10.5)
Chloride: 102 meq/L (ref 96–112)
Creatinine, Ser: 0.82 mg/dL (ref 0.40–1.20)
GFR: 80.92 mL/min (ref >60.00)
Glucose, Bld: 77 mg/dL (ref 70–99)
Potassium: 3.9 meq/L (ref 3.5–5.1)
Sodium: 138 meq/L (ref 135–145)
Total Bilirubin: 0.5 mg/dL (ref 0.2–1.2)
Total Protein: 8.1 g/dL (ref 6.0–8.3)

## 2023-04-09 LAB — CBC WITH DIFFERENTIAL/PLATELET
Basophils Absolute: 0.1 10*3/uL (ref 0.0–0.1)
Basophils Relative: 1.2 % (ref 0.0–3.0)
Eosinophils Absolute: 0.1 10*3/uL (ref 0.0–0.7)
Eosinophils Relative: 1.5 % (ref 0.0–5.0)
HCT: 43.6 % (ref 36.0–46.0)
Hemoglobin: 14.4 g/dL (ref 12.0–15.0)
Lymphocytes Relative: 34.8 % (ref 12.0–46.0)
Lymphs Abs: 2.7 10*3/uL (ref 0.7–4.0)
MCHC: 33 g/dL (ref 30.0–36.0)
MCV: 94.8 fL (ref 78.0–100.0)
Monocytes Absolute: 0.7 10*3/uL (ref 0.1–1.0)
Monocytes Relative: 9.1 % (ref 3.0–12.0)
Neutro Abs: 4.1 10*3/uL (ref 1.4–7.7)
Neutrophils Relative %: 53.4 % (ref 43.0–77.0)
Platelets: 494 10*3/uL — ABNORMAL HIGH (ref 150.0–400.0)
RBC: 4.6 Mil/uL (ref 3.87–5.11)
RDW: 13.3 % (ref 11.5–15.5)
WBC: 7.6 10*3/uL (ref 4.0–10.5)

## 2023-04-09 LAB — C-REACTIVE PROTEIN: CRP: <1 mg/dL (ref 0.5–20.0)

## 2023-04-09 LAB — TSH: TSH: 0.76 u[IU]/mL (ref 0.35–5.50)

## 2023-04-09 LAB — HEPATITIS C ANTIBODY: Hepatitis C Ab: NONREACTIVE

## 2023-04-23 DIAGNOSIS — D539 Nutritional anemia, unspecified: Secondary | ICD-10-CM | POA: Diagnosis not present

## 2023-04-23 DIAGNOSIS — E7800 Pure hypercholesterolemia, unspecified: Secondary | ICD-10-CM | POA: Diagnosis not present

## 2023-04-23 DIAGNOSIS — Z131 Encounter for screening for diabetes mellitus: Secondary | ICD-10-CM | POA: Diagnosis not present

## 2023-04-23 DIAGNOSIS — R5383 Other fatigue: Secondary | ICD-10-CM | POA: Diagnosis not present

## 2023-04-23 DIAGNOSIS — Z79899 Other long term (current) drug therapy: Secondary | ICD-10-CM | POA: Diagnosis not present

## 2023-04-23 DIAGNOSIS — E559 Vitamin D deficiency, unspecified: Secondary | ICD-10-CM | POA: Diagnosis not present

## 2023-04-24 DIAGNOSIS — H5213 Myopia, bilateral: Secondary | ICD-10-CM | POA: Diagnosis not present

## 2023-05-06 ENCOUNTER — Encounter: Payer: Self-pay | Admitting: Neurology

## 2023-05-13 DIAGNOSIS — M545 Low back pain, unspecified: Secondary | ICD-10-CM | POA: Diagnosis not present

## 2023-05-13 DIAGNOSIS — Z79899 Other long term (current) drug therapy: Secondary | ICD-10-CM | POA: Diagnosis not present

## 2023-06-10 DIAGNOSIS — M542 Cervicalgia: Secondary | ICD-10-CM | POA: Diagnosis not present

## 2023-06-10 DIAGNOSIS — M545 Low back pain, unspecified: Secondary | ICD-10-CM | POA: Diagnosis not present

## 2023-06-10 DIAGNOSIS — Z79899 Other long term (current) drug therapy: Secondary | ICD-10-CM | POA: Diagnosis not present

## 2023-06-10 DIAGNOSIS — Z6826 Body mass index (BMI) 26.0-26.9, adult: Secondary | ICD-10-CM | POA: Diagnosis not present

## 2023-06-10 DIAGNOSIS — R030 Elevated blood-pressure reading, without diagnosis of hypertension: Secondary | ICD-10-CM | POA: Diagnosis not present

## 2023-07-08 DIAGNOSIS — R030 Elevated blood-pressure reading, without diagnosis of hypertension: Secondary | ICD-10-CM | POA: Diagnosis not present

## 2023-07-08 DIAGNOSIS — Z6826 Body mass index (BMI) 26.0-26.9, adult: Secondary | ICD-10-CM | POA: Diagnosis not present

## 2023-07-08 DIAGNOSIS — M545 Low back pain, unspecified: Secondary | ICD-10-CM | POA: Diagnosis not present

## 2023-07-08 DIAGNOSIS — Z79899 Other long term (current) drug therapy: Secondary | ICD-10-CM | POA: Diagnosis not present

## 2023-08-05 DIAGNOSIS — Z6826 Body mass index (BMI) 26.0-26.9, adult: Secondary | ICD-10-CM | POA: Diagnosis not present

## 2023-08-05 DIAGNOSIS — R030 Elevated blood-pressure reading, without diagnosis of hypertension: Secondary | ICD-10-CM | POA: Diagnosis not present

## 2023-08-05 DIAGNOSIS — Z79899 Other long term (current) drug therapy: Secondary | ICD-10-CM | POA: Diagnosis not present

## 2023-08-05 DIAGNOSIS — M545 Low back pain, unspecified: Secondary | ICD-10-CM | POA: Diagnosis not present

## 2023-09-04 DIAGNOSIS — Z79899 Other long term (current) drug therapy: Secondary | ICD-10-CM | POA: Diagnosis not present

## 2023-09-04 DIAGNOSIS — R030 Elevated blood-pressure reading, without diagnosis of hypertension: Secondary | ICD-10-CM | POA: Diagnosis not present

## 2023-09-04 DIAGNOSIS — Z6826 Body mass index (BMI) 26.0-26.9, adult: Secondary | ICD-10-CM | POA: Diagnosis not present

## 2023-09-04 DIAGNOSIS — M545 Low back pain, unspecified: Secondary | ICD-10-CM | POA: Diagnosis not present

## 2023-09-09 ENCOUNTER — Ambulatory Visit: Admitting: Neurology
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
## 2023-09-15 ENCOUNTER — Ambulatory Visit (INDEPENDENT_AMBULATORY_CARE_PROVIDER_SITE_OTHER): Admitting: Neurology

## 2023-09-15 ENCOUNTER — Encounter: Payer: Self-pay | Admitting: Neurology

## 2023-09-15 VITALS — BP 122/99 | HR 108 | Ht 62.0 in | Wt 140.0 lb

## 2023-09-15 DIAGNOSIS — G4440 Drug-induced headache, not elsewhere classified, not intractable: Secondary | ICD-10-CM

## 2023-09-15 DIAGNOSIS — G43709 Chronic migraine without aura, not intractable, without status migrainosus: Secondary | ICD-10-CM | POA: Diagnosis not present

## 2023-09-15 MED ORDER — ONDANSETRON HCL 4 MG PO TABS: 4.0000 mg | ORAL_TABLET | Freq: Three times a day (TID) | ORAL | 5 refills | Status: AC | PRN

## 2023-09-15 MED ORDER — AIMOVIG 140 MG/ML ~~LOC~~ SOAJ: 140.0000 mg | SUBCUTANEOUS | 5 refills | Status: AC

## 2023-09-15 MED ORDER — SUMATRIPTAN SUCCINATE 100 MG PO TABS: ORAL_TABLET | ORAL | 5 refills | Status: AC

## 2023-09-15 NOTE — Patient Instructions (Signed)
  Start Aimovig injection every 28 days.  Contact us  in 3 months update and we can increase dose if needed. Take sumatriptan 100mg  at earliest onset of headache.  May repeat dose once in 2 hours if needed.  Maximum 2 tablets in 24 hours. Ondansetron  for nausea Stop Excedrin Limit use of pain relievers to no more than 9 days out of the month.  These medications include acetaminophen , NSAIDs (ibuprofen /Advil /Motrin , naproxen /Aleve , triptans (Imitrex/sumatriptan), Excedrin, and narcotics.  This will help reduce risk of rebound headaches. Be aware of common food triggers Routine exercise Stay adequately hydrated (aim for 64 oz water  daily) Keep headache diary Maintain proper stress management Maintain proper sleep hygiene Do not skip meals Consider supplements:  MigreLief

## 2023-09-19 ENCOUNTER — Telehealth: Payer: Self-pay

## 2023-09-19 ENCOUNTER — Other Ambulatory Visit (HOSPITAL_COMMUNITY): Payer: Self-pay

## 2023-09-19 NOTE — Telephone Encounter (Signed)
 PA request has been Submitted. New Encounter has been or will be created for follow up. For additional info see Pharmacy Prior Auth telephone encounter from 09-19-2023.

## 2023-09-19 NOTE — Telephone Encounter (Signed)
 Pharmacy Patient Advocate Encounter   Received notification from Pt Calls Messages that prior authorization for Aimovig 140MG /ML auto-injectors is required/requested.   Insurance verification completed.   The patient is insured through [facility] .   Per test claim: PA required; PA submitted to above mentioned insurance via CoverMyMeds Key/confirmation #/EOC [id number] Status is pending

## 2023-09-19 NOTE — Telephone Encounter (Signed)
 PA needed for Aimovig 140 mg

## 2023-09-22 ENCOUNTER — Other Ambulatory Visit (HOSPITAL_COMMUNITY): Payer: Self-pay

## 2023-09-22 NOTE — Telephone Encounter (Signed)
 Pharmacy Patient Advocate Encounter  Received notification from [facility] that Prior Authorization for Aimovig  140MG /ML auto-injectors has been APPROVED from 09-19-2023 to 12-20-2023   PA #/Case ID/Reference #: [id number]

## 2023-10-03 DIAGNOSIS — M545 Low back pain, unspecified: Secondary | ICD-10-CM | POA: Diagnosis not present

## 2023-10-03 DIAGNOSIS — Z79899 Other long term (current) drug therapy: Secondary | ICD-10-CM | POA: Diagnosis not present

## 2023-10-30 DIAGNOSIS — G43909 Migraine, unspecified, not intractable, without status migrainosus: Secondary | ICD-10-CM | POA: Diagnosis not present

## 2023-10-30 DIAGNOSIS — Z79899 Other long term (current) drug therapy: Secondary | ICD-10-CM | POA: Diagnosis not present

## 2023-10-30 DIAGNOSIS — M545 Low back pain, unspecified: Secondary | ICD-10-CM | POA: Diagnosis not present

## 2023-10-30 DIAGNOSIS — R030 Elevated blood-pressure reading, without diagnosis of hypertension: Secondary | ICD-10-CM | POA: Diagnosis not present

## 2023-11-17 ENCOUNTER — Other Ambulatory Visit: Payer: Self-pay | Admitting: Family Medicine

## 2023-11-17 DIAGNOSIS — Z1231 Encounter for screening mammogram for malignant neoplasm of breast: Secondary | ICD-10-CM

## 2023-11-27 DIAGNOSIS — R030 Elevated blood-pressure reading, without diagnosis of hypertension: Secondary | ICD-10-CM | POA: Diagnosis not present

## 2023-11-27 DIAGNOSIS — G43909 Migraine, unspecified, not intractable, without status migrainosus: Secondary | ICD-10-CM | POA: Diagnosis not present

## 2023-11-27 DIAGNOSIS — M545 Low back pain, unspecified: Secondary | ICD-10-CM | POA: Diagnosis not present

## 2023-11-27 DIAGNOSIS — Z79899 Other long term (current) drug therapy: Secondary | ICD-10-CM | POA: Diagnosis not present

## 2023-12-04 DIAGNOSIS — E559 Vitamin D deficiency, unspecified: Secondary | ICD-10-CM | POA: Diagnosis not present

## 2023-12-04 DIAGNOSIS — E7800 Pure hypercholesterolemia, unspecified: Secondary | ICD-10-CM | POA: Diagnosis not present

## 2023-12-04 DIAGNOSIS — Z131 Encounter for screening for diabetes mellitus: Secondary | ICD-10-CM | POA: Diagnosis not present

## 2023-12-04 DIAGNOSIS — R5383 Other fatigue: Secondary | ICD-10-CM | POA: Diagnosis not present

## 2023-12-04 DIAGNOSIS — D539 Nutritional anemia, unspecified: Secondary | ICD-10-CM | POA: Diagnosis not present

## 2023-12-05 ENCOUNTER — Ambulatory Visit
Admission: RE | Admit: 2023-12-05 | Discharge: 2023-12-05 | Disposition: A | Discharge status: Home / Self Care | Source: Ambulatory Visit | Attending: Family Medicine | Admitting: Family Medicine

## 2023-12-05 DIAGNOSIS — Z1231 Encounter for screening mammogram for malignant neoplasm of breast: Secondary | ICD-10-CM

## 2023-12-29 DIAGNOSIS — D2371 Other benign neoplasm of skin of right lower limb, including hip: Secondary | ICD-10-CM | POA: Diagnosis not present

## 2023-12-29 DIAGNOSIS — L918 Other hypertrophic disorders of the skin: Secondary | ICD-10-CM | POA: Diagnosis not present

## 2023-12-30 DIAGNOSIS — Z79899 Other long term (current) drug therapy: Secondary | ICD-10-CM | POA: Diagnosis not present

## 2023-12-30 DIAGNOSIS — Z800 Family history of malignant neoplasm of digestive organs: Secondary | ICD-10-CM | POA: Diagnosis not present

## 2023-12-30 DIAGNOSIS — F439 Reaction to severe stress, unspecified: Secondary | ICD-10-CM | POA: Diagnosis not present

## 2023-12-30 DIAGNOSIS — M545 Low back pain, unspecified: Secondary | ICD-10-CM | POA: Diagnosis not present

## 2024-01-01 DIAGNOSIS — Z79899 Other long term (current) drug therapy: Secondary | ICD-10-CM | POA: Diagnosis not present

## 2024-01-05 DIAGNOSIS — B079 Viral wart, unspecified: Secondary | ICD-10-CM | POA: Diagnosis not present

## 2024-01-13 DIAGNOSIS — Z23 Encounter for immunization: Secondary | ICD-10-CM | POA: Diagnosis not present

## 2024-01-13 DIAGNOSIS — E7800 Pure hypercholesterolemia, unspecified: Secondary | ICD-10-CM | POA: Diagnosis not present

## 2024-01-13 DIAGNOSIS — R945 Abnormal results of liver function studies: Secondary | ICD-10-CM | POA: Diagnosis not present

## 2024-01-13 DIAGNOSIS — E663 Overweight: Secondary | ICD-10-CM | POA: Diagnosis not present

## 2024-02-12 DIAGNOSIS — E7800 Pure hypercholesterolemia, unspecified: Secondary | ICD-10-CM | POA: Diagnosis not present

## 2024-02-12 DIAGNOSIS — R945 Abnormal results of liver function studies: Secondary | ICD-10-CM | POA: Diagnosis not present

## 2024-02-12 DIAGNOSIS — E663 Overweight: Secondary | ICD-10-CM | POA: Diagnosis not present

## 2024-02-13 DIAGNOSIS — M545 Low back pain, unspecified: Secondary | ICD-10-CM | POA: Diagnosis not present

## 2024-02-13 DIAGNOSIS — R030 Elevated blood-pressure reading, without diagnosis of hypertension: Secondary | ICD-10-CM | POA: Diagnosis not present

## 2024-02-17 DIAGNOSIS — Z79899 Other long term (current) drug therapy: Secondary | ICD-10-CM | POA: Diagnosis not present

## 2024-02-18 DIAGNOSIS — Z800 Family history of malignant neoplasm of digestive organs: Secondary | ICD-10-CM | POA: Diagnosis not present

## 2024-02-18 DIAGNOSIS — Z1211 Encounter for screening for malignant neoplasm of colon: Secondary | ICD-10-CM | POA: Diagnosis not present

## 2024-02-26 DIAGNOSIS — Z1211 Encounter for screening for malignant neoplasm of colon: Secondary | ICD-10-CM | POA: Diagnosis not present

## 2024-02-26 DIAGNOSIS — K635 Polyp of colon: Secondary | ICD-10-CM | POA: Diagnosis not present

## 2024-03-12 DIAGNOSIS — M545 Low back pain, unspecified: Secondary | ICD-10-CM | POA: Diagnosis not present

## 2024-03-12 DIAGNOSIS — Z79899 Other long term (current) drug therapy: Secondary | ICD-10-CM | POA: Diagnosis not present

## 2024-03-12 DIAGNOSIS — R030 Elevated blood-pressure reading, without diagnosis of hypertension: Secondary | ICD-10-CM | POA: Diagnosis not present

## 2024-03-15 DIAGNOSIS — E7800 Pure hypercholesterolemia, unspecified: Secondary | ICD-10-CM | POA: Diagnosis not present

## 2024-03-15 DIAGNOSIS — R945 Abnormal results of liver function studies: Secondary | ICD-10-CM | POA: Diagnosis not present

## 2024-03-15 DIAGNOSIS — R635 Abnormal weight gain: Secondary | ICD-10-CM | POA: Diagnosis not present

## 2024-03-17 DIAGNOSIS — Z79899 Other long term (current) drug therapy: Secondary | ICD-10-CM | POA: Diagnosis not present

## 2024-03-18 ENCOUNTER — Encounter: Payer: Self-pay | Admitting: Neurology

## 2024-03-31 ENCOUNTER — Ambulatory Visit: Admitting: Neurology

## 2024-04-12 DIAGNOSIS — R000 Tachycardia, unspecified: Secondary | ICD-10-CM | POA: Diagnosis not present

## 2024-04-12 DIAGNOSIS — M545 Low back pain, unspecified: Secondary | ICD-10-CM | POA: Diagnosis not present

## 2024-04-12 DIAGNOSIS — R030 Elevated blood-pressure reading, without diagnosis of hypertension: Secondary | ICD-10-CM | POA: Diagnosis not present

## 2024-04-12 DIAGNOSIS — Z79899 Other long term (current) drug therapy: Secondary | ICD-10-CM | POA: Diagnosis not present

## 2024-04-14 DIAGNOSIS — R945 Abnormal results of liver function studies: Secondary | ICD-10-CM | POA: Diagnosis not present

## 2024-04-14 DIAGNOSIS — R635 Abnormal weight gain: Secondary | ICD-10-CM | POA: Diagnosis not present

## 2024-04-14 DIAGNOSIS — E7800 Pure hypercholesterolemia, unspecified: Secondary | ICD-10-CM | POA: Diagnosis not present

## 2024-04-16 DIAGNOSIS — Z79899 Other long term (current) drug therapy: Secondary | ICD-10-CM | POA: Diagnosis not present

## 2024-05-18 ENCOUNTER — Telehealth: Payer: Self-pay | Admitting: Neurology

## 2024-05-18 NOTE — Telephone Encounter (Signed)
 Pt called in this afternoon and  Pt stated  that the pharmacy needs to have a prior authorization for prescription:   Erenumab -aooe (AIMOVIG ) 140 MG/ML SOAJ  Pt states that the insurance will not cover Aimovig  and so she needs another form of Aimovig  to help with her migraine headache that her insurance will cover. Pt is out of the Aimovig .Thanks

## 2024-05-19 ENCOUNTER — Other Ambulatory Visit (HOSPITAL_COMMUNITY): Payer: Self-pay

## 2024-05-19 ENCOUNTER — Telehealth: Payer: Self-pay | Admitting: Pharmacy Technician

## 2024-05-19 NOTE — Telephone Encounter (Signed)
 PA has been submitted, and telephone encounter has been created. Please see telephone encounter dated 1.14.26.

## 2024-05-19 NOTE — Telephone Encounter (Signed)
 Pharmacy Patient Advocate Encounter   Received notification from Pt Calls Messages that prior authorization for AIMOVIG  140MG  is required/requested.   Insurance verification completed.   The patient is insured through [facility] MEDICAID.   Per test claim: PA required; PA submitted to above mentioned insurance via Latent Key/confirmation #/EOC [id number] Status is pending

## 2024-05-25 ENCOUNTER — Other Ambulatory Visit (HOSPITAL_COMMUNITY): Payer: Self-pay

## 2024-05-25 NOTE — Telephone Encounter (Signed)
 Pharmacy Patient Advocate Encounter  Received notification from [facility] MEDICAID that Prior Authorization for Aimovig  has been APPROVED from 05/19/2024 to 05/19/2025. Unable to obtain price due to refill too soon rejection, last fill date 05/21/2024 next available fill date02/10/2024   PA #/Case ID/Reference #: [id number]

## 2024-07-07 ENCOUNTER — Ambulatory Visit: Admitting: Neurology
